# Patient Record
Sex: Female | Born: 1980 | Race: Black or African American | Hispanic: No | Marital: Single | Smoking: Former smoker
Health system: Southern US, Community
[De-identification: ages and names within clinical notes are randomized; demographics above are authoritative.]

---

## 2012-03-09 ENCOUNTER — Emergency Department (HOSPITAL_COMMUNITY)
Admission: EM | Admit: 2012-03-09 | Discharge: 2012-03-09 | Payer: Self-pay | Attending: Emergency Medicine | Admitting: Emergency Medicine

## 2012-03-09 ENCOUNTER — Encounter (HOSPITAL_COMMUNITY): Payer: Self-pay | Admitting: Emergency Medicine

## 2012-03-09 DIAGNOSIS — M791 Myalgia, unspecified site: Secondary | ICD-10-CM

## 2012-03-09 DIAGNOSIS — IMO0001 Reserved for inherently not codable concepts without codable children: Secondary | ICD-10-CM | POA: Insufficient documentation

## 2012-03-09 DIAGNOSIS — F17200 Nicotine dependence, unspecified, uncomplicated: Secondary | ICD-10-CM | POA: Insufficient documentation

## 2012-03-09 LAB — URINALYSIS, ROUTINE W REFLEX MICROSCOPIC
Glucose, UA: NEGATIVE mg/dL
Ketones, ur: NEGATIVE mg/dL
Leukocytes, UA: NEGATIVE
Protein, ur: NEGATIVE mg/dL
Urobilinogen, UA: 2 mg/dL — ABNORMAL HIGH (ref 0.0–1.0)

## 2012-03-09 MED ORDER — IBUPROFEN 800 MG PO TABS
800.0000 mg | ORAL_TABLET | Freq: Once | ORAL | Status: AC
  Administered 2012-03-09: 800 mg via ORAL
  Filled 2012-03-09: qty 1

## 2012-03-09 MED ORDER — IBUPROFEN 800 MG PO TABS: 800.0000 mg | ORAL_TABLET | Freq: Once | ORAL | Status: AC

## 2012-03-09 NOTE — ED Provider Notes (Signed)
Medical screening examination/treatment/procedure(s) were performed by non-physician practitioner and as supervising physician I was immediately available for consultation/collaboration.   Alecea Trego A Peniel Hass, MD 03/09/12 1523 

## 2012-03-09 NOTE — Discharge Instructions (Signed)
FOLLOW UP WITH HERE OR WITH YOUR DOCTOR IF SYMPTOMS WORSEN. PUSH FLUIDS. IBUPROFEN AS DIRECTED.

## 2012-03-09 NOTE — ED Provider Notes (Signed)
History     CSN: [id number]  Arrival date & time 03/09/12  1127   First MD Initiated Contact with Patient 03/09/12 1155      Chief Complaint  Patient presents with  . Generalized Body Aches    (Consider location/radiation/quality/duration/timing/severity/associated sxs/prior treatment) Patient is a 31 y.o. female presenting with musculoskeletal pain. The history is provided by the patient.  Muscle Pain This is a new problem. The current episode started yesterday. The problem occurs constantly. The problem has been unchanged. Associated symptoms include myalgias. Pertinent negatives include no abdominal pain, chest pain, fever, nausea, rash or vomiting. 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 She became concerned when she developed symptoms of generalized body aches that she was becoming ill. No fever, N, V, D, change in appetite, dysuria, cough, congestion. She reports that last week she had some mild diarrhea that resolved prior to onset of current aches..    No past medical history on file.  No past surgical history on file.  No family history on file.  History  Substance Use Topics  . Smoking status: Current Everyday Smoker -- 5 years    Types: Cigarettes  . Smokeless tobacco: Not on file  . Alcohol Use: Yes     occasionally    OB History    Grav Para Term Preterm Abortions TAB SAB Ect Mult Living                  Review of Systems  Constitutional: Negative.  Negative for fever.  Respiratory: Negative for shortness of breath.   Cardiovascular: Negative for chest pain.  Gastrointestinal: Negative for nausea, vomiting and abdominal pain.  Genitourinary: Negative for dysuria and vaginal discharge.  Musculoskeletal: Positive for myalgias.  Skin: Negative for rash.    Allergies  Review of patient's allergies indicates no known allergies.  Home Medications   Current Outpatient Rx  Name Route  Sig Dispense Refill  . PHENYLEPH-CPM-DM-ASPIRIN 7.06-11-09-325 MG PO TBEF Oral Take 2 tablets by mouth every 6 (six) hours as needed. cold      BP 106/68  Pulse 71  Temp(Src) 98.1 F (36.7 C) (Oral)  Resp 16  SpO2 100%  LMP 03/01/2012  Physical Exam  Constitutional: She is oriented to person, place, and time. She appears well-developed and well-nourished.  HENT:  Head: Normocephalic.  Mouth/Throat: Oropharynx is clear and moist.       Nasal mucosa mildly erythematous and swollen.  Neck: Normal range of motion. Neck supple.  Cardiovascular: Normal rate and regular rhythm.   Pulmonary/Chest: Effort normal and breath sounds normal.  Abdominal: Soft. Bowel sounds are normal. There is no tenderness. There is no rebound and no guarding.  Musculoskeletal: Normal range of motion. She exhibits no edema and no tenderness.       FROM all joint. No swelling over any area observed.  Neurological: She is alert and oriented to person, place, and time.  Skin: Skin is warm and dry. No rash noted.       No redness.  Psychiatric: She has a normal mood and affect.    ED Course  Procedures (including critical care time)   Labs Reviewed  URINALYSIS, ROUTINE W REFLEX MICROSCOPIC  PREGNANCY, URINE   Results for orders placed during the hospital encounter of 03/09/12  URINALYSIS, ROUTINE W REFLEX MICROSCOPIC      Component Value Range   Color, Urine YELLOW  YELLOW    APPearance CLOUDY (*)  CLEAR    Specific Gravity, Urine 1.020  1.005 - 1.030    pH 7.5  5.0 - 8.0    Glucose, UA NEGATIVE  NEGATIVE (mg/dL)   Hgb urine dipstick NEGATIVE  NEGATIVE    Bilirubin Urine NEGATIVE  NEGATIVE    Ketones, ur NEGATIVE  NEGATIVE (mg/dL)   Protein, ur NEGATIVE  NEGATIVE (mg/dL)   Urobilinogen, UA 2.0 (*) 0.0 - 1.0 (mg/dL)   Nitrite NEGATIVE  NEGATIVE    Leukocytes, UA NEGATIVE  NEGATIVE   PREGNANCY, URINE      Component Value Range   Preg Test, Ur NEGATIVE  NEGATIVE     No results found. No diagnosis  found.  1. Myalgias   MDM  Urine normal, no evidence dehydration or infection. VSS, normal. Discussed treatment with ibuprofen and follow up.        Rodena Medin, PA-C 03/09/12 1317

## 2012-03-09 NOTE — ED Notes (Signed)
Pt c/o generalized body aches and weakness since yesterday.

## 2012-12-26 ENCOUNTER — Emergency Department (HOSPITAL_COMMUNITY)
Admission: EM | Admit: 2012-12-26 | Discharge: 2012-12-26 | Disposition: A | Discharge status: Home / Self Care | Payer: BC Managed Care – PPO | Attending: Emergency Medicine | Admitting: Emergency Medicine

## 2012-12-26 ENCOUNTER — Encounter (HOSPITAL_COMMUNITY): Payer: Self-pay | Admitting: Emergency Medicine

## 2012-12-26 DIAGNOSIS — F17200 Nicotine dependence, unspecified, uncomplicated: Secondary | ICD-10-CM | POA: Insufficient documentation

## 2012-12-26 DIAGNOSIS — M940 Chondrocostal junction syndrome [Tietze]: Secondary | ICD-10-CM | POA: Insufficient documentation

## 2012-12-26 MED ORDER — HYDROCODONE-ACETAMINOPHEN 5-500 MG PO CAPS: 1.0000 | ORAL_CAPSULE | Freq: Four times a day (QID) | ORAL | Status: DC | PRN

## 2012-12-26 MED ORDER — IBUPROFEN 800 MG PO TABS
800.0000 mg | ORAL_TABLET | Freq: Once | ORAL | Status: AC
  Administered 2012-12-26: 800 mg via ORAL
  Filled 2012-12-26: qty 1

## 2012-12-26 MED ORDER — NAPROXEN 500 MG PO TABS: 500.0000 mg | ORAL_TABLET | Freq: Two times a day (BID) | ORAL | Status: DC

## 2012-12-26 NOTE — ED Notes (Signed)
Patient instructed on use of IS. Patient did return demo.

## 2012-12-26 NOTE — ED Provider Notes (Signed)
History     CSN: [id number]  Arrival date & time 12/26/12  4098   First MD Initiated Contact with Patient 12/26/12 [phone]      Chief Complaint  Patient presents with  . Chest Pain    (Consider location/radiation/quality/duration/timing/severity/associated sxs/prior treatment) HPI 32 year old female presents to emergency room complaining of anterior chest pain since last evening around 8:00. She denies any injury to the area, no new activities, no cough. Pain is worse with movement and with palpation. No treatment prior to arrival. Patient is a CNA. She does report that yesterday at work she was doing several patients move around. She denies any acute event. Pain came on gradually. She denies any fever or chills. No similar symptoms. No leg swelling, no birth control, no family history of PE or DVT. No pleuritic pain    History reviewed. No pertinent past medical history.  History reviewed. No pertinent past surgical history.  History reviewed. No pertinent family history.  History  Substance Use Topics  . Smoking status: Current Every Day Smoker -- 0.50 packs/day for 6 years    Types: Cigarettes  . Smokeless tobacco: Not on file  . Alcohol Use: Yes     Comment: occasionally    OB History   Grav Para Term Preterm Abortions TAB SAB Ect Mult Living                  Review of Systems  See History of Present Illness; otherwise all other systems are reviewed and negative Allergies  Review of patient's allergies indicates no known allergies.  Home Medications   Current Outpatient Rx  Name  Route  Sig  Dispense  Refill  . hydrocodone-acetaminophen (LORCET-HD) 5-500 MG per capsule   Oral   Take 1 capsule by mouth every 6 (six) hours as needed for pain.   30 capsule   0   . naproxen (NAPROSYN) 500 MG tablet   Oral   Take 1 tablet (500 mg total) by mouth 2 (two) times daily.   30 tablet   0     BP 116/69  Pulse 56  Temp(Src) 98.7 F (37.1 C) (Oral)  Ht 5\' 6"   (1.676 m)  Wt 185 lb (83.915 kg)  BMI 29.87 kg/m2  SpO2 97%  LMP 12/22/2012  Physical Exam  Nursing note and vitals reviewed. Constitutional: She is oriented to person, place, and time. She appears well-developed and well-nourished.  HENT:  Head: Normocephalic and atraumatic.  Nose: Nose normal.  Mouth/Throat: Oropharynx is clear and moist.  Eyes: Conjunctivae and EOM are normal. Pupils are equal, round, and reactive to light.  Neck: Normal range of motion. Neck supple. No JVD present. No tracheal deviation present. No thyromegaly present.  Cardiovascular: Normal rate, regular rhythm, normal heart sounds and intact distal pulses.  Exam reveals no gallop and no friction rub.   No murmur heard. Pulmonary/Chest: Effort normal and breath sounds normal. No stridor. No respiratory distress. She has no wheezes. She has no rales. She exhibits tenderness (patient with sternal pain with palpation. Patient of the sternum reproduces symptoms  completely).  Abdominal: Soft. Bowel sounds are normal. She exhibits no distension and no mass. There is no tenderness. There is no rebound and no guarding.  Musculoskeletal: Normal range of motion. She exhibits no edema and no tenderness.  Lymphadenopathy:    She has no cervical adenopathy.  Neurological: She is alert and oriented to person, place, and time. She has normal reflexes. No cranial nerve deficit. She exhibits  normal muscle tone. Coordination normal.  Skin: Skin is warm and dry. No rash noted. No erythema. No pallor.  Psychiatric: She has a normal mood and affect. Her behavior is normal. Judgment and thought content normal.    ED Course  Procedures (including critical care time)  Labs Reviewed - No data to display No results found.   Date: 12/26/2012  Rate: 64  Rhythm: normal sinus rhythm  QRS Axis: normal  Intervals: normal  ST/T Wave abnormalities: normal  Conduction Disutrbances:none  Narrative Interpretation:   Old EKG Reviewed: none  available    1. Acute costochondritis       MDM  32 year old female with anterior chest wall pain, consistent with costochondritis. EKG unremarkable. Will treat with anti-inflammatories and short course of opiates.        Olivia Mackie, MD 12/26/12 [phone]

## 2012-12-26 NOTE — ED Notes (Signed)
Patient c/o mid sternal chest pain which started at 2000 on 12-25-2012. Patient denies cough, n/v. States pain is increased with movement. Patient denies taking any medication for pain at home PTA.

## 2012-12-26 NOTE — ED Notes (Signed)
Patient tearful while walking to room, a wheelchair was offered but patient declined. Patient placed in gown, EKG completed. VSS. Patient denies any medical history or current medications, neg for family cardiac history. Patient states she had pizza for dinner last night, was laying on the couch watching television when pain started at approx 2000 on 12/25/12. Patient states this is the first occurrence of this pain.

## 2014-01-17 ENCOUNTER — Encounter (HOSPITAL_COMMUNITY): Payer: Self-pay | Admitting: Emergency Medicine

## 2014-01-17 ENCOUNTER — Emergency Department (HOSPITAL_COMMUNITY)
Admission: EM | Admit: 2014-01-17 | Discharge: 2014-01-17 | Disposition: A | Discharge status: Home / Self Care | Payer: Medicaid - Out of State | Attending: Emergency Medicine | Admitting: Emergency Medicine

## 2014-01-17 DIAGNOSIS — F17200 Nicotine dependence, unspecified, uncomplicated: Secondary | ICD-10-CM | POA: Insufficient documentation

## 2014-01-17 DIAGNOSIS — J029 Acute pharyngitis, unspecified: Secondary | ICD-10-CM | POA: Insufficient documentation

## 2014-01-17 LAB — RAPID STREP SCREEN (MED CTR MEBANE ONLY): STREPTOCOCCUS, GROUP A SCREEN (DIRECT): NEGATIVE

## 2014-01-17 MED ORDER — DEXAMETHASONE SODIUM PHOSPHATE 10 MG/ML IJ SOLN
10.0000 mg | Freq: Once | INTRAMUSCULAR | Status: AC
  Administered 2014-01-17: 10 mg via INTRAMUSCULAR
  Filled 2014-01-17: qty 1

## 2014-01-17 MED ORDER — TRAMADOL HCL 50 MG PO TABS: 50.0000 mg | ORAL_TABLET | Freq: Four times a day (QID) | ORAL | Status: DC | PRN

## 2014-01-17 MED ORDER — TRAMADOL HCL 50 MG PO TABS
50.0000 mg | ORAL_TABLET | Freq: Once | ORAL | Status: AC
  Administered 2014-01-17: 50 mg via ORAL
  Filled 2014-01-17: qty 1

## 2014-01-17 NOTE — ED Notes (Signed)
Pt reports right side neck pain that she describes as sore throat. NAD. Reports taking OTC with no relief.

## 2014-01-17 NOTE — ED Provider Notes (Signed)
CSN: [id number]     Arrival date & time 01/17/14  1711 History  This chart was scribed for Erin BleacherJosh Derrick Tiegs, PA, working with Nelia Shiobert L Beaton, MD, by Ardelia Memsylan Malpass ED Scribe. This patient was seen in room WTR6/WTR6 and the patient's care was started at 7:13 PM.   Chief Complaint  Patient presents with  . Sore Throat    The history is provided by the patient. No language interpreter was used.    HPI Comments: Thornton Dalesshley Warzecha is a 33 y.o. female who presents to the Emergency Department complaining of a sore throat over the past few days. She states that she has not been able to sleep at night due to this pain. She states that she has been taking OTC medications without relief.   History reviewed. No pertinent past medical history. History reviewed. No pertinent past surgical history. No family history on file. History  Substance Use Topics  . Smoking status: Current Every Day Smoker -- 0.50 packs/day for 6 years    Types: Cigarettes  . Smokeless tobacco: Not on file  . Alcohol Use: Yes     Comment: occasionally   OB History   Grav Para Term Preterm Abortions TAB SAB Ect Mult Living                 Review of Systems  Constitutional: Negative for fever, chills and fatigue.  HENT: Positive for sore throat. Negative for congestion, ear pain, rhinorrhea, sinus pressure and trouble swallowing.   Eyes: Negative for redness.  Respiratory: Negative for cough and wheezing.   Gastrointestinal: Negative for nausea, vomiting, abdominal pain and diarrhea.  Genitourinary: Negative for dysuria.  Musculoskeletal: Negative for myalgias and neck stiffness.  Skin: Negative for rash.  Neurological: Negative for headaches.  Hematological: Negative for adenopathy.    Allergies  Review of patient's allergies indicates no known allergies.  Home Medications   Current Outpatient Rx  Name  Route  Sig  Dispense  Refill  . traMADol (ULTRAM) 50 MG tablet   Oral   Take 1 tablet (50 mg total) by mouth  every 6 (six) hours as needed.   10 tablet   0    Triage Vitals: BP 111/87  Pulse 69  Temp(Src) 98.1 F (36.7 C) (Oral)  Resp 16  Ht 5\' 6"  (1.676 m)  Wt 195 lb (88.451 kg)  BMI 31.49 kg/m2  SpO2 100%  LMP 01/17/2014  Physical Exam  Nursing note and vitals reviewed. Constitutional: She appears well-developed and well-nourished. No distress.  HENT:  Head: Normocephalic and atraumatic.  Right Ear: Tympanic membrane, external ear and ear canal normal.  Left Ear: Tympanic membrane, external ear and ear canal normal.  Nose: Nose normal. No mucosal edema or rhinorrhea.  Mouth/Throat: Uvula is midline and mucous membranes are normal. Mucous membranes are not dry. No oral lesions. No trismus in the jaw. No uvula swelling. Posterior oropharyngeal erythema present. No oropharyngeal exudate, posterior oropharyngeal edema or tonsillar abscesses.  Eyes: Conjunctivae and EOM are normal. Right eye exhibits no discharge. Left eye exhibits no discharge.  Neck: Normal range of motion. Neck supple. No tracheal deviation present.  Cardiovascular: Normal rate, regular rhythm and normal heart sounds.   Pulmonary/Chest: Effort normal and breath sounds normal. No respiratory distress. She has no wheezes. She has no rales.  Abdominal: Soft. There is no tenderness.  Musculoskeletal: Normal range of motion.  Lymphadenopathy:    She has no cervical adenopathy.  Neurological: She is alert.  Skin: Skin is warm  and dry.  Psychiatric: She has a normal mood and affect. Her behavior is normal.    ED Course  Procedures (including critical care time)  DIAGNOSTIC STUDIES: Oxygen Saturation is 100% on RA, normal by my interpretation.    COORDINATION OF CARE: 7:17 PM- Discussed negative Strep test findings. Will discharge with Tramadol. Advised return precautions. Pt advised of plan for treatment and pt agrees.  Labs Review Labs Reviewed  RAPID STREP SCREEN  CULTURE, GROUP A STREP   Imaging Review No  results found.   EKG Interpretation None      Patient seen and examined.   Vital signs reviewed and are as follows: Filed Vitals:   01/17/14 1933  BP: 128/45  Pulse: 77  Temp:   Resp: 18   Patient counseled on supportive care for viral URI and s/s to return including worsening symptoms, persistent fever, persistent vomiting, or if they have any other concerns. Urged to see PCP if symptoms persist for more than 3 days. Patient verbalizes understanding and agrees with plan.    MDM   Final diagnoses:  Sore throat   Patient with sore throat, no fever, negative strep. No peritonsillar abscess. No deep space infection suspected.  I personally performed the services described in this documentation, which was scribed in my presence. The recorded information has been reviewed and is accurate.   Renne Crigler, PA-C 01/17/14 2022

## 2014-01-17 NOTE — Discharge Instructions (Signed)
Please read and follow all provided instructions.  Your diagnoses today include:  1. Sore throat     Tests performed today include:  Strep test: was negative for strep throat  Vital signs. See below for your results today.   Medications prescribed:   Tramadol - narcotic-like pain medication  DO NOT drive or perform any activities that require you to be awake and alert because this medicine can make you drowsy.   Home care instructions:  Please read the educational materials provided and follow any instructions contained in this packet.  Follow-up instructions: Please follow-up with your primary care provider as needed for further evaluation of your symptoms.  If you do not have a primary care doctor -- see below for referral information.   Return instructions:   Please return to the Emergency Department if you experience worsening symptoms.   Return if you have worsening problems swallowing, your neck becomes swollen, you cannot swallow your saliva or your voice becomes muffled.   Return with high persistent fever, persistent vomiting, or if you have trouble breathing.   Please return if you have any other emergent concerns.  Additional Information:  Your vital signs today were: BP 111/87   Pulse 69   Temp(Src) 98.1 F (36.7 C) (Oral)   Resp 16   Ht 5\' 6"  (1.676 m)   Wt 195 lb (88.451 kg)   BMI 31.49 kg/m2   SpO2 100%   LMP 01/17/2014 If your blood pressure (BP) was elevated above 135/85 this visit, please have this repeated by your doctor within one month. --------------

## 2014-01-19 LAB — CULTURE, GROUP A STREP

## 2014-01-30 NOTE — ED Provider Notes (Signed)
Medical screening examination/treatment/procedure(s) were performed by non-physician practitioner and as supervising physician I was immediately available for consultation/collaboration.   Nelia Shiobert L Daizha Anand, MD 01/30/14 1113

## 2015-06-05 ENCOUNTER — Emergency Department (HOSPITAL_COMMUNITY)
Admission: EM | Admit: 2015-06-05 | Discharge: 2015-06-05 | Disposition: A | Discharge status: Home / Self Care | Payer: Medicaid - Out of State | Attending: Emergency Medicine | Admitting: Emergency Medicine

## 2015-06-05 ENCOUNTER — Encounter (HOSPITAL_COMMUNITY): Payer: Self-pay | Admitting: Emergency Medicine

## 2015-06-05 ENCOUNTER — Emergency Department (HOSPITAL_COMMUNITY): Payer: Medicaid - Out of State

## 2015-06-05 DIAGNOSIS — R0789 Other chest pain: Secondary | ICD-10-CM | POA: Diagnosis not present

## 2015-06-05 DIAGNOSIS — Z720 Tobacco use: Secondary | ICD-10-CM | POA: Insufficient documentation

## 2015-06-05 DIAGNOSIS — R079 Chest pain, unspecified: Secondary | ICD-10-CM | POA: Diagnosis present

## 2015-06-05 LAB — CBC WITH DIFFERENTIAL/PLATELET
BASOS PCT: 0 % (ref 0–1)
Basophils Absolute: 0 10*3/uL (ref 0.0–0.1)
EOS ABS: 0.2 10*3/uL (ref 0.0–0.7)
EOS PCT: 2 % (ref 0–5)
HEMATOCRIT: 42.9 % (ref 36.0–46.0)
HEMOGLOBIN: 14.3 g/dL (ref 12.0–15.0)
Lymphocytes Relative: 23 % (ref 12–46)
Lymphs Abs: 2 10*3/uL (ref 0.7–4.0)
MCH: 30.6 pg (ref 26.0–34.0)
MCHC: 33.3 g/dL (ref 30.0–36.0)
MCV: 91.9 fL (ref 78.0–100.0)
Monocytes Absolute: 0.6 10*3/uL (ref 0.1–1.0)
Monocytes Relative: 7 % (ref 3–12)
NEUTROS PCT: 68 % (ref 43–77)
Neutro Abs: 5.9 10*3/uL (ref 1.7–7.7)
Platelets: 311 10*3/uL (ref 150–400)
RBC: 4.67 MIL/uL (ref 3.87–5.11)
RDW: 11.9 % (ref 11.5–15.5)
WBC: 8.7 10*3/uL (ref 4.0–10.5)

## 2015-06-05 LAB — I-STAT TROPONIN, ED: TROPONIN I, POC: 0 ng/mL (ref 0.00–0.08)

## 2015-06-05 LAB — BASIC METABOLIC PANEL
ANION GAP: 6 (ref 5–15)
BUN: 10 mg/dL (ref 6–20)
CALCIUM: 9.5 mg/dL (ref 8.9–10.3)
CHLORIDE: 107 mmol/L (ref 101–111)
CO2: 26 mmol/L (ref 22–32)
Creatinine, Ser: 0.86 mg/dL (ref 0.44–1.00)
GFR calc non Af Amer: >60 mL/min (ref >60)
GLUCOSE: 98 mg/dL (ref 65–99)
POTASSIUM: 3.8 mmol/L (ref 3.5–5.1)
SODIUM: 139 mmol/L (ref 135–145)

## 2015-06-05 MED ORDER — MORPHINE SULFATE 4 MG/ML IJ SOLN
4.0000 mg | Freq: Once | INTRAMUSCULAR | Status: AC
  Administered 2015-06-05: 4 mg via INTRAVENOUS
  Filled 2015-06-05: qty 1

## 2015-06-05 MED ORDER — NAPROXEN 375 MG PO TABS
375.0000 mg | ORAL_TABLET | Freq: Two times a day (BID) | ORAL | Status: DC
Start: 2015-06-05 — End: 2016-06-09

## 2015-06-05 NOTE — ED Provider Notes (Signed)
CSN: [id number]     Arrival date & time 06/05/15  1120 History   First MD Initiated Contact with Patient 06/05/15 1136     Chief Complaint  Patient presents with  . Chest Pain     (Consider location/radiation/quality/duration/timing/severity/associated sxs/prior Treatment) HPI Comments: Patient presents to the emergency department with chief complaint of central chest pain started yesterday. Patient states that it is worsened with movement and deep breathing. She reports that it is also worsened with a cough. She denies any associated fevers, chills, shortness of breath, nausea, vomiting, abdominal pain, or other symptoms. She states that she has felt pain similar to this in the past, but didn't know what it was. She has not tried taking anything to alleviate her symptoms. She states that she is in everyday smoker. She denies any history of PE, or DVT. Denies any cardiac history. Denies any other medical problems.  The history is provided by the patient. No language interpreter was used.    History reviewed. No pertinent past medical history. History reviewed. No pertinent past surgical history. History reviewed. No pertinent family history. History  Substance Use Topics  . Smoking status: Current Every Day Smoker -- 0.50 packs/day for 6 years    Types: Cigarettes  . Smokeless tobacco: Not on file  . Alcohol Use: Yes     Comment: occasionally   OB History    No data available     Review of Systems  Constitutional: Negative for fever and chills.  Respiratory: Negative for shortness of breath.   Cardiovascular: Positive for chest pain.  Gastrointestinal: Negative for nausea, vomiting, diarrhea and constipation.  Genitourinary: Negative for dysuria.  All other systems reviewed and are negative.     Allergies  Review of patient's allergies indicates no known allergies.  Home Medications   Prior to Admission medications   Not on File   BP 118/68 mmHg  Pulse 68  Temp(Src)  98.2 F (36.8 C) (Oral)  Resp 18  SpO2 97% Physical Exam  Constitutional: She is oriented to person, place, and time. She appears well-developed and well-nourished.  HENT:  Head: Normocephalic and atraumatic.  Eyes: Conjunctivae and EOM are normal. Pupils are equal, round, and reactive to light.  Neck: Normal range of motion. Neck supple.  Cardiovascular: Normal rate and regular rhythm.  Exam reveals no gallop and no friction rub.   No murmur heard. Pulmonary/Chest: Effort normal and breath sounds normal. No respiratory distress. She has no wheezes. She has no rales. She exhibits no tenderness.  Anterior chest wall tender to palpation Clear to auscultation bilaterally  Abdominal: Soft. Bowel sounds are normal. She exhibits no distension and no mass. There is no tenderness. There is no rebound and no guarding.  Musculoskeletal: Normal range of motion. She exhibits no edema or tenderness.  Neurological: She is alert and oriented to person, place, and time.  Skin: Skin is warm and dry.  Psychiatric: She has a normal mood and affect. Her behavior is normal. Judgment and thought content normal.  Nursing note and vitals reviewed.   ED Course  Procedures (including critical care time) Results for orders placed or performed during the hospital encounter of 06/05/15  CBC with Differential/Platelet  Result Value Ref Range   WBC 8.7 4.0 - 10.5 K/uL   RBC 4.67 3.87 - 5.11 MIL/uL   Hemoglobin 14.3 12.0 - 15.0 g/dL   HCT 40.9 81.1 - 91.4 %   MCV 91.9 78.0 - 100.0 fL   MCH 30.6 26.0 -  34.0 pg   MCHC 33.3 30.0 - 36.0 g/dL   RDW 86.5 78.4 - 69.6 %   Platelets 311 150 - 400 K/uL   Neutrophils Relative % 68 43 - 77 %   Neutro Abs 5.9 1.7 - 7.7 K/uL   Lymphocytes Relative 23 12 - 46 %   Lymphs Abs 2.0 0.7 - 4.0 K/uL   Monocytes Relative 7 3 - 12 %   Monocytes Absolute 0.6 0.1 - 1.0 K/uL   Eosinophils Relative 2 0 - 5 %   Eosinophils Absolute 0.2 0.0 - 0.7 K/uL   Basophils Relative 0 0 - 1 %    Basophils Absolute 0.0 0.0 - 0.1 K/uL  Basic metabolic panel  Result Value Ref Range   Sodium 139 135 - 145 mmol/L   Potassium 3.8 3.5 - 5.1 mmol/L   Chloride 107 101 - 111 mmol/L   CO2 26 22 - 32 mmol/L   Glucose, Bld 98 65 - 99 mg/dL   BUN 10 6 - 20 mg/dL   Creatinine, Ser 2.95 0.44 - 1.00 mg/dL   Calcium 9.5 8.9 - 28.4 mg/dL   GFR calc non Af Amer >60 >60 mL/min   GFR calc Af Amer >60 >60 mL/min   Anion gap 6 5 - 15  I-Stat Troponin, ED (not at [facility])  Result Value Ref Range   Troponin i, poc 0.00 0.00 - 0.08 ng/mL   Comment 3           Dg Chest 2 View  06/05/2015   CLINICAL DATA:  Chest pain since yesterday  EXAM: CHEST  2 VIEW  COMPARISON:  None.  FINDINGS: The heart size and mediastinal contours are within normal limits. Both lungs are clear. The visualized skeletal structures are unremarkable.  IMPRESSION: No active cardiopulmonary disease.   Electronically Signed   By: Sherian Rein M.D.   On: 06/05/2015 12:19     Imaging Review No results found.   EKG Interpretation   Date/Time:  Tuesday June 05 2015 11:30:41 EDT Ventricular Rate:  70 PR Interval:  168 QRS Duration: 79 QT Interval:  386 QTC Calculation: 416 R Axis:   79 Text Interpretation:  Sinus rhythm No significant change since last  tracing Confirmed by JACUBOWITZ  MD, SAM [phone]) on 06/05/2015 11:34:44 AM      MDM   Final diagnoses:  Chest pain  Chest wall pain    Patient with anterior chest pain 1 day. Pain has been constant. It is worsened with movement and palpation. She has not taken anything for the pain.  Heart score is 1, troponin is negative, EKG reassuring, CXR negative, PERC negative. Suspect that this is chest wall pain, as the pain is reproducible with palpation.    Roxy Horseman, PA-C 06/05/15 1340  Doug Sou, MD 06/05/15 1640

## 2015-06-05 NOTE — ED Notes (Signed)
Pt reports central chest pain that started yesterday. Pain worse with movement and deep breath. Pt denies any other associated symptoms.

## 2015-06-05 NOTE — Discharge Instructions (Signed)

## 2016-06-09 ENCOUNTER — Emergency Department (HOSPITAL_COMMUNITY): Payer: No Typology Code available for payment source

## 2016-06-09 ENCOUNTER — Encounter (HOSPITAL_COMMUNITY): Payer: Self-pay | Admitting: Emergency Medicine

## 2016-06-09 ENCOUNTER — Emergency Department (HOSPITAL_COMMUNITY)
Admission: EM | Admit: 2016-06-09 | Discharge: 2016-06-09 | Disposition: A | Discharge status: Home / Self Care | Payer: No Typology Code available for payment source | Attending: Emergency Medicine | Admitting: Emergency Medicine

## 2016-06-09 DIAGNOSIS — Z23 Encounter for immunization: Secondary | ICD-10-CM | POA: Diagnosis not present

## 2016-06-09 DIAGNOSIS — Y939 Activity, unspecified: Secondary | ICD-10-CM | POA: Insufficient documentation

## 2016-06-09 DIAGNOSIS — M62838 Other muscle spasm: Secondary | ICD-10-CM | POA: Insufficient documentation

## 2016-06-09 DIAGNOSIS — Y999 Unspecified external cause status: Secondary | ICD-10-CM | POA: Insufficient documentation

## 2016-06-09 DIAGNOSIS — Y92410 Unspecified street and highway as the place of occurrence of the external cause: Secondary | ICD-10-CM | POA: Insufficient documentation

## 2016-06-09 DIAGNOSIS — S4991XA Unspecified injury of right shoulder and upper arm, initial encounter: Secondary | ICD-10-CM | POA: Diagnosis present

## 2016-06-09 DIAGNOSIS — F17210 Nicotine dependence, cigarettes, uncomplicated: Secondary | ICD-10-CM | POA: Insufficient documentation

## 2016-06-09 DIAGNOSIS — T148XXA Other injury of unspecified body region, initial encounter: Secondary | ICD-10-CM

## 2016-06-09 DIAGNOSIS — S46911A Strain of unspecified muscle, fascia and tendon at shoulder and upper arm level, right arm, initial encounter: Secondary | ICD-10-CM | POA: Insufficient documentation

## 2016-06-09 MED ORDER — NAPROXEN 500 MG PO TABS: 500.0000 mg | ORAL_TABLET | Freq: Two times a day (BID) | ORAL | 0 refills | Status: DC | PRN

## 2016-06-09 MED ORDER — CYCLOBENZAPRINE HCL 10 MG PO TABS: 10.0000 mg | ORAL_TABLET | Freq: Three times a day (TID) | ORAL | 0 refills | Status: DC | PRN

## 2016-06-09 MED ORDER — TETANUS-DIPHTH-ACELL PERTUSSIS 5-2.5-18.5 LF-MCG/0.5 IM SUSP
0.5000 mL | Freq: Once | INTRAMUSCULAR | Status: AC
  Administered 2016-06-09: 0.5 mL via INTRAMUSCULAR
  Filled 2016-06-09: qty 0.5

## 2016-06-09 MED ORDER — NAPROXEN 500 MG PO TABS
500.0000 mg | ORAL_TABLET | Freq: Once | ORAL | Status: AC
  Administered 2016-06-09: 500 mg via ORAL
  Filled 2016-06-09: qty 1
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
## 2016-06-09 NOTE — Discharge Instructions (Signed)
Take naprosyn as directed for inflammation and pain with tylenol for breakthrough pain and flexeril for muscle relaxation. Do not drive or operate machinery with muscle relaxant use. Ice to areas of soreness for the next 24 hours and then may move to heat, no more than 20 minutes at a time every hour for each. Expect to be sore for the next few days and follow up with [facility] in 1-2 weeks to establish care and for recheck of ongoing symptoms. Return to ER for emergent changing or worsening of symptoms.

## 2017-09-15 ENCOUNTER — Emergency Department (HOSPITAL_BASED_OUTPATIENT_CLINIC_OR_DEPARTMENT_OTHER)
Admission: EM | Admit: 2017-09-15 | Discharge: 2017-09-15 | Discharge status: Against Medical Advice | Payer: Medicaid Other | Source: Home / Self Care

## 2017-09-15 ENCOUNTER — Other Ambulatory Visit: Payer: Self-pay

## 2017-09-15 ENCOUNTER — Emergency Department (HOSPITAL_BASED_OUTPATIENT_CLINIC_OR_DEPARTMENT_OTHER)
Admission: EM | Admit: 2017-09-15 | Discharge: 2017-09-15 | Disposition: A | Discharge status: Home / Self Care | Payer: Medicaid Other | Attending: Emergency Medicine | Admitting: Emergency Medicine

## 2017-09-15 ENCOUNTER — Encounter (HOSPITAL_BASED_OUTPATIENT_CLINIC_OR_DEPARTMENT_OTHER): Payer: Self-pay | Admitting: *Deleted

## 2017-09-15 ENCOUNTER — Encounter (HOSPITAL_BASED_OUTPATIENT_CLINIC_OR_DEPARTMENT_OTHER): Payer: Self-pay | Admitting: Emergency Medicine

## 2017-09-15 DIAGNOSIS — N898 Other specified noninflammatory disorders of vagina: Secondary | ICD-10-CM | POA: Diagnosis present

## 2017-09-15 DIAGNOSIS — Z5321 Procedure and treatment not carried out due to patient leaving prior to being seen by health care provider: Secondary | ICD-10-CM | POA: Diagnosis not present

## 2017-09-15 LAB — URINALYSIS, ROUTINE W REFLEX MICROSCOPIC
Bilirubin Urine: NEGATIVE
GLUCOSE, UA: NEGATIVE mg/dL
Hgb urine dipstick: NEGATIVE
Ketones, ur: NEGATIVE mg/dL
Leukocytes, UA: NEGATIVE
Nitrite: NEGATIVE
PROTEIN: NEGATIVE mg/dL
Specific Gravity, Urine: 1.025 (ref 1.005–1.030)
pH: 6.5 (ref 5.0–8.0)

## 2017-09-15 LAB — PREGNANCY, URINE: PREG TEST UR: NEGATIVE

## 2017-09-15 NOTE — ED Triage Notes (Signed)
Patient to the nurse first desk asking about how much longer the wait was. Wait times and order of patients taken to a room explained. Patient upset. Asked patient for a repeat urine sample  - denies wanting to provide one.

## 2017-09-15 NOTE — ED Triage Notes (Signed)
Patient walked out to parking lot. Patient did not talk to this RN prior to leaving

## 2017-09-15 NOTE — ED Triage Notes (Signed)
Vaginal discharge and abd pain x 5 days. States she thinks has BV. Was here earlier but left before being seen.

## 2017-09-15 NOTE — ED Triage Notes (Signed)
Patient called for room. Patient did not answer. Looked for patient in parking lot, no patient noted.

## 2017-09-15 NOTE — ED Triage Notes (Signed)
Abdominal pain x 5 days. Vaginal discharge. She thinks she has BV.

## 2017-09-15 NOTE — ED Notes (Signed)
Called to room No answer

## 2017-09-15 NOTE — ED Notes (Signed)
The patient reports she had to leave and she is now back to be seen. Urine has already been collected.

## 2017-10-27 ENCOUNTER — Encounter (HOSPITAL_BASED_OUTPATIENT_CLINIC_OR_DEPARTMENT_OTHER): Payer: Self-pay | Admitting: *Deleted

## 2017-10-27 ENCOUNTER — Emergency Department (HOSPITAL_BASED_OUTPATIENT_CLINIC_OR_DEPARTMENT_OTHER)
Admission: EM | Admit: 2017-10-27 | Discharge: 2017-10-27 | Disposition: A | Discharge status: Home / Self Care | Payer: Medicaid Other | Attending: Emergency Medicine | Admitting: Emergency Medicine

## 2017-10-27 ENCOUNTER — Other Ambulatory Visit: Payer: Self-pay

## 2017-10-27 DIAGNOSIS — M545 Low back pain: Secondary | ICD-10-CM | POA: Diagnosis present

## 2017-10-27 DIAGNOSIS — Z5321 Procedure and treatment not carried out due to patient leaving prior to being seen by health care provider: Secondary | ICD-10-CM | POA: Diagnosis not present

## 2017-10-27 NOTE — ED Notes (Signed)
Pt informed registration that she had gotten a doctors appointment today and is leaving the department

## 2017-10-27 NOTE — ED Triage Notes (Signed)
Lower back pain for a week. She feels she hurt it while lifting.

## 2019-05-21 ENCOUNTER — Emergency Department (HOSPITAL_BASED_OUTPATIENT_CLINIC_OR_DEPARTMENT_OTHER): Payer: Self-pay

## 2019-05-21 ENCOUNTER — Encounter (HOSPITAL_BASED_OUTPATIENT_CLINIC_OR_DEPARTMENT_OTHER): Payer: Self-pay | Admitting: *Deleted

## 2019-05-21 ENCOUNTER — Other Ambulatory Visit: Payer: Self-pay

## 2019-05-21 ENCOUNTER — Emergency Department (HOSPITAL_BASED_OUTPATIENT_CLINIC_OR_DEPARTMENT_OTHER)
Admission: EM | Admit: 2019-05-21 | Discharge: 2019-05-21 | Disposition: A | Discharge status: Home / Self Care | Payer: Self-pay | Attending: Emergency Medicine | Admitting: Emergency Medicine

## 2019-05-21 DIAGNOSIS — Y998 Other external cause status: Secondary | ICD-10-CM | POA: Insufficient documentation

## 2019-05-21 DIAGNOSIS — Y929 Unspecified place or not applicable: Secondary | ICD-10-CM | POA: Insufficient documentation

## 2019-05-21 DIAGNOSIS — S8002XA Contusion of left knee, initial encounter: Secondary | ICD-10-CM | POA: Insufficient documentation

## 2019-05-21 DIAGNOSIS — W010XXA Fall on same level from slipping, tripping and stumbling without subsequent striking against object, initial encounter: Secondary | ICD-10-CM | POA: Insufficient documentation

## 2019-05-21 DIAGNOSIS — Y9302 Activity, running: Secondary | ICD-10-CM | POA: Insufficient documentation

## 2019-05-21 DIAGNOSIS — F17210 Nicotine dependence, cigarettes, uncomplicated: Secondary | ICD-10-CM | POA: Insufficient documentation

## 2019-05-21 NOTE — Discharge Instructions (Signed)
Please read and follow all provided instructions.  Your diagnoses today include:  1. Contusion of left knee, initial encounter     Tests performed today include:  An x-ray of the affected area - does NOT show any broken bones  Vital signs. See below for your results today.   Medications prescribed:  Please use over-the-counter NSAID medications (ibuprofen, naproxen) as directed on the packaging for pain.   Take any prescribed medications only as directed.  Home care instructions:   Follow any educational materials contained in this packet  Follow R.I.C.E. Protocol:  R - rest your injury   I  - use ice on injury without applying directly to skin  C - compress injury with bandage or splint  E - elevate the injury as much as possible  Follow-up instructions: Please follow-up with your primary care provider or the provided orthopedic physician (bone specialist) if you continue to have significant pain in 1 week. In this case you may have a more severe injury that requires further care.   Return instructions:   Please return if your toes or feet are numb or tingling, appear gray or blue, or you have severe pain (also elevate the leg and loosen splint or wrap if you were given one)  Please return to the Emergency Department if you experience worsening symptoms.   Please return if you have any other emergent concerns.  Additional Information:  Your vital signs today were: BP 127/88 (BP Location: Right Arm)    Pulse 86    Temp 98.5 F (36.9 C) (Oral)    Resp 18    Ht 5\' 5"  (1.651 m)    Wt 90.7 kg    LMP 05/16/2019    SpO2 100%    BMI 33.28 kg/m  If your blood pressure (BP) was elevated above 135/85 this visit, please have this repeated by your doctor within one month.
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
## 2019-05-21 NOTE — ED Notes (Signed)
ED Provider at bedside. 
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
## 2019-12-31 ENCOUNTER — Other Ambulatory Visit: Payer: Self-pay

## 2019-12-31 ENCOUNTER — Emergency Department (HOSPITAL_BASED_OUTPATIENT_CLINIC_OR_DEPARTMENT_OTHER)
Admission: EM | Admit: 2019-12-31 | Discharge: 2019-12-31 | Disposition: A | Discharge status: Home / Self Care | Payer: Self-pay | Attending: Emergency Medicine | Admitting: Emergency Medicine

## 2019-12-31 ENCOUNTER — Encounter (HOSPITAL_BASED_OUTPATIENT_CLINIC_OR_DEPARTMENT_OTHER): Payer: Self-pay | Admitting: *Deleted

## 2019-12-31 DIAGNOSIS — N760 Acute vaginitis: Secondary | ICD-10-CM | POA: Insufficient documentation

## 2019-12-31 DIAGNOSIS — N3000 Acute cystitis without hematuria: Secondary | ICD-10-CM | POA: Insufficient documentation

## 2019-12-31 DIAGNOSIS — B9689 Other specified bacterial agents as the cause of diseases classified elsewhere: Secondary | ICD-10-CM

## 2019-12-31 DIAGNOSIS — F17210 Nicotine dependence, cigarettes, uncomplicated: Secondary | ICD-10-CM | POA: Insufficient documentation

## 2019-12-31 LAB — URINALYSIS, ROUTINE W REFLEX MICROSCOPIC
Bilirubin Urine: NEGATIVE
Glucose, UA: NEGATIVE mg/dL
Hgb urine dipstick: NEGATIVE
Ketones, ur: NEGATIVE mg/dL
Nitrite: NEGATIVE
Protein, ur: NEGATIVE mg/dL
Specific Gravity, Urine: 1.025 (ref 1.005–1.030)
pH: 6 (ref 5.0–8.0)

## 2019-12-31 LAB — URINALYSIS, MICROSCOPIC (REFLEX)

## 2019-12-31 LAB — WET PREP, GENITAL
Sperm: NONE SEEN
Trich, Wet Prep: NONE SEEN
Yeast Wet Prep HPF POC: NONE SEEN

## 2019-12-31 LAB — PREGNANCY, URINE: Preg Test, Ur: NEGATIVE

## 2019-12-31 MED ORDER — LIDOCAINE HCL (PF) 1 % IJ SOLN
INTRAMUSCULAR | Status: AC
  Administered 2019-12-31: 19:00:00 1 mL
  Filled 2019-12-31: qty 5

## 2019-12-31 MED ORDER — DOXYCYCLINE HYCLATE 100 MG PO CAPS: 100.0000 mg | ORAL_CAPSULE | Freq: Two times a day (BID) | ORAL | 0 refills | Status: AC

## 2019-12-31 MED ORDER — METRONIDAZOLE 500 MG PO TABS: 500.0000 mg | ORAL_TABLET | Freq: Two times a day (BID) | ORAL | 0 refills | Status: AC

## 2019-12-31 MED ORDER — CEFTRIAXONE SODIUM 500 MG IJ SOLR
500.0000 mg | Freq: Once | INTRAMUSCULAR | Status: AC
  Administered 2019-12-31: 500 mg via INTRAMUSCULAR
  Filled 2019-12-31: qty 500

## 2019-12-31 NOTE — ED Provider Notes (Addendum)
[facility] EMERGENCY DEPARTMENT Provider Note   CSN: [id number] Arrival date & time: 12/31/19  1655     History Chief Complaint  Patient presents with  . SEXUALLY TRANSMITTED DISEASE    Erin Savage is a 39 y.o. female.  Patient here with concern for possible UTI versus STD.  Has had some vaginal discomfort for the last several weeks.  Has tried some over-the-counter medications for yeast infections with minimal improvement.  She is concerned about possible STD but denies any current abdominal pain or vaginal pain.  No discharge.  The history is provided by the patient.  Female GU Problem This is a new problem. The current episode started more than 1 week ago. The problem occurs daily. The problem has not changed since onset.Pertinent negatives include no chest pain, no abdominal pain, no headaches and no shortness of breath. Nothing aggravates the symptoms. Nothing relieves the symptoms. She has tried nothing for the symptoms. The treatment provided no relief.       History reviewed. No pertinent past medical history.  There are no problems to display for this patient.   History reviewed. No pertinent surgical history.   OB History   No obstetric history on file.     History reviewed. No pertinent family history.  Social History   Tobacco Use  . Smoking status: Current Every Day Smoker    Packs/day: 0.50    Years: 6.00    Pack years: 3.00    Types: Cigarettes  . Smokeless tobacco: Never Used  Substance Use Topics  . Alcohol use: Yes    Comment:  wine occasionally  . Drug use: No    Home Medications Prior to Admission medications   Medication Sig Start Date End Date Taking? Authorizing Provider  cyclobenzaprine (FLEXERIL) 10 MG tablet Take 1 tablet (10 mg total) by mouth 3 (three) times daily as needed for muscle spasms. 06/09/16   Street, [place], PA-C  doxycycline (VIBRAMYCIN) 100 MG capsule Take 1 capsule (100 mg total) by mouth 2 (two) times  daily for 7 days. 12/31/19 01/07/20  Maxey Ransom, DO  metroNIDAZOLE (FLAGYL) 500 MG tablet Take 1 tablet (500 mg total) by mouth 2 (two) times daily for 7 days. 12/31/19 01/07/20  Shad Ledvina, DO  naproxen (NAPROSYN) 500 MG tablet Take 1 tablet (500 mg total) by mouth 2 (two) times daily as needed for mild pain, moderate pain or headache (TAKE WITH MEALS.). 06/09/16   Street, [place], PA-C    Allergies    Patient has no known allergies.  Review of Systems   Review of Systems  Constitutional: Negative for chills and fever.  HENT: Negative for ear pain and sore throat.   Eyes: Negative for pain and visual disturbance.  Respiratory: Negative for cough and shortness of breath.   Cardiovascular: Negative for chest pain and palpitations.  Gastrointestinal: Negative for abdominal pain and vomiting.  Genitourinary: Positive for frequency. Negative for decreased urine volume, difficulty urinating, dyspareunia, dysuria, hematuria, pelvic pain, urgency, vaginal bleeding, vaginal discharge and vaginal pain.  Musculoskeletal: Negative for arthralgias and back pain.  Skin: Negative for color change and rash.  Neurological: Negative for seizures, syncope and headaches.  All other systems reviewed and are negative.   Physical Exam Updated Vital Signs  ED Triage Vitals  Enc Vitals Group     BP 12/31/19 1705 121/88     Pulse Rate 12/31/19 1705 89     Resp 12/31/19 1705 18     Temp 12/31/19 1705 98.1  F (36.7 C)     Temp Source 12/31/19 1705 Oral     SpO2 12/31/19 1705 100 %     Weight 12/31/19 1706 190 lb (86.2 kg)     Height 12/31/19 1706 5\' 5"  (1.651 m)     Head Circumference --      Peak Flow --      Pain Score 12/31/19 1706 7     Pain Loc --      Pain Edu? --      Excl. in [place]? --     Physical Exam Vitals and nursing note reviewed.  Constitutional:      General: She is not in acute distress.    Appearance: She is well-developed.  HENT:     Head: Normocephalic and atraumatic.    Eyes:     Conjunctiva/sclera: Conjunctivae normal.  Cardiovascular:     Rate and Rhythm: Normal rate and regular rhythm.     Heart sounds: No murmur.  Pulmonary:     Effort: Pulmonary effort is normal. No respiratory distress.     Breath sounds: Normal breath sounds.  Abdominal:     General: There is no distension.     Palpations: Abdomen is soft. There is no mass.     Tenderness: There is no abdominal tenderness. There is no guarding.     Hernia: No hernia is present.  Musculoskeletal:     Cervical back: Neck supple.  Skin:    General: Skin is warm and dry.  Neurological:     Mental Status: She is alert.  Psychiatric:        Mood and Affect: Mood normal.     ED Results / Procedures / Treatments   Labs (all labs ordered are listed, but only abnormal results are displayed) Labs Reviewed  WET PREP, GENITAL - Abnormal; Notable for the following components:      Result Value   Clue Cells Wet Prep HPF POC PRESENT (*)    WBC, Wet Prep HPF POC FEW (*)    All other components within normal limits  URINALYSIS, ROUTINE W REFLEX MICROSCOPIC - Abnormal; Notable for the following components:   APPearance HAZY (*)    Leukocytes,Ua TRACE (*)    All other components within normal limits  URINALYSIS, MICROSCOPIC (REFLEX) - Abnormal; Notable for the following components:   Bacteria, UA MANY (*)    All other components within normal limits  URINE CULTURE  PREGNANCY, URINE  GC/CHLAMYDIA PROBE AMP ([place]) NOT AT [facility]    EKG None  Radiology No results found.  Procedures Procedures (including critical care time)  Medications Ordered in ED Medications  cefTRIAXone (ROCEPHIN) injection 500 mg (has no administration in time range)    ED Course  I have reviewed the triage vital signs and the nursing notes.  Pertinent labs & imaging results that were available during my care of the patient were reviewed by me and considered in my medical decision making (see chart for  details).    MDM Rules/Calculators/A&P                      Erin [facility] is a 39 year old female with no significant medical history who presents to the ED with concern for STD or UTI.  Patient normal vitals.  No fever.  She has had lower abdominal discomfort and urinary frequency for the last several weeks.  Has tried some over-the-counter medications with no relief.  Concern for STD.  Denies any vaginal discharge  or bleeding.  No abdominal tenderness on exam.  GU exam deferred due to minimal symptoms and no fever.  Urinalysis possibly concerning for UTI and will treat.  Empirically treated for gonorrhea and chlamydia as well.  Positive for bacterial vaginosis.  Will treat with Flagyl.  Suspect symptoms are from bacterial vaginosis and possible UTI.  Gonorrhea and Chlamydia testing has been sent for.  She does not have any fevers or severe pain and no concern for pelvic inflammatory disease.  Pregnancy test is negative.  Discharged from ED in good condition.  Given return precautions.  This chart was dictated using voice recognition software.  Despite best efforts to proofread,  errors can occur which can change the documentation meaning.    Final Clinical Impression(s) / ED Diagnoses Final diagnoses:  Bacterial vaginosis  Acute cystitis without hematuria    Rx / DC Orders ED Discharge Orders         Ordered    doxycycline (VIBRAMYCIN) 100 MG capsule  2 times daily     12/31/19 1828    metroNIDAZOLE (FLAGYL) 500 MG tablet  2 times daily     12/31/19 1828           Virgina Norfolk, DO 12/31/19 1830    Virgina Norfolk, DO 12/31/19 [phone]

## 2019-12-31 NOTE — ED Triage Notes (Signed)
Pt reports that she would like to be tested for STDs. Unsure of vaginal discharge, reports dysuria and abnormal bleeding.

## 2020-01-03 ENCOUNTER — Telehealth (HOSPITAL_COMMUNITY): Payer: Self-pay

## 2020-01-03 LAB — GC/CHLAMYDIA PROBE AMP (~~LOC~~) NOT AT ARMC
Chlamydia: NEGATIVE
Neisseria Gonorrhea: NEGATIVE

## 2020-01-03 LAB — URINE CULTURE: Culture: >=100000 — AB

## 2020-01-04 ENCOUNTER — Telehealth: Payer: Self-pay

## 2020-01-04 NOTE — Telephone Encounter (Signed)
Post ED Visit - Positive Culture Follow-up  Culture report reviewed by antimicrobial stewardship pharmacist: [facility] []  , Pharm.D. []  Enzo Bi, Pharm.D., BCPS AQ-ID []  , Pharm.D., BCPS []  Celedonio Miyamoto, Pharm.D., BCPS []  [place], Garvin Fila.D., BCPS, AAHIVP []  , Pharm.D., BCPS, AAHIVP []  Georgina Pillion, PharmD, BCPS []  , PharmD, BCPS []  [place], PharmD, BCPS []  [address], PharmD []  , PharmD, BCPS []  Estella Husk, PharmD [facility] []  Lysle Pearl, PharmD []  , PharmD []  Phillips Climes, PharmD []  , Rph []  Agapito Games) , PharmD []  Verlan Friends, PharmD []  , PharmD []  Mervyn Gay, PharmD []  , PharmD []  Vinnie Level, PharmD []  Bufford Lope, PharmD []  , PharmD []  Len Childs, PharmD   Positive urine culture  and no further patient follow-up is required at this time.  01/04/2020, 9:29 AM

## 2021-08-14 ENCOUNTER — Other Ambulatory Visit: Payer: Self-pay

## 2021-08-14 ENCOUNTER — Encounter (HOSPITAL_BASED_OUTPATIENT_CLINIC_OR_DEPARTMENT_OTHER): Payer: Self-pay

## 2021-08-14 ENCOUNTER — Emergency Department (HOSPITAL_BASED_OUTPATIENT_CLINIC_OR_DEPARTMENT_OTHER): Payer: Medicaid Other

## 2021-08-14 ENCOUNTER — Emergency Department (HOSPITAL_BASED_OUTPATIENT_CLINIC_OR_DEPARTMENT_OTHER)
Admission: EM | Admit: 2021-08-14 | Discharge: 2021-08-15 | Disposition: A | Discharge status: Home / Self Care | Payer: Medicaid Other | Attending: Emergency Medicine | Admitting: Emergency Medicine

## 2021-08-14 DIAGNOSIS — R2242 Localized swelling, mass and lump, left lower limb: Secondary | ICD-10-CM | POA: Diagnosis not present

## 2021-08-14 DIAGNOSIS — M79662 Pain in left lower leg: Secondary | ICD-10-CM | POA: Insufficient documentation

## 2021-08-14 DIAGNOSIS — Z5321 Procedure and treatment not carried out due to patient leaving prior to being seen by health care provider: Secondary | ICD-10-CM | POA: Insufficient documentation

## 2021-08-14 DIAGNOSIS — Z87891 Personal history of nicotine dependence: Secondary | ICD-10-CM | POA: Insufficient documentation

## 2021-08-14 NOTE — ED Triage Notes (Addendum)
Pt c/o pain to left LE from knee to foot x 2 weeks-pain started after jogging-denies known trauma-states she noticed swelling to LE x today-NAD-limping gait

## 2021-08-15 ENCOUNTER — Encounter (HOSPITAL_BASED_OUTPATIENT_CLINIC_OR_DEPARTMENT_OTHER): Payer: Self-pay

## 2021-08-15 ENCOUNTER — Emergency Department (HOSPITAL_BASED_OUTPATIENT_CLINIC_OR_DEPARTMENT_OTHER)
Admission: EM | Admit: 2021-08-15 | Discharge: 2021-08-15 | Disposition: A | Discharge status: Home / Self Care | Payer: Medicaid Other | Source: Home / Self Care | Attending: Emergency Medicine | Admitting: Emergency Medicine

## 2021-08-15 ENCOUNTER — Emergency Department (HOSPITAL_BASED_OUTPATIENT_CLINIC_OR_DEPARTMENT_OTHER): Payer: Medicaid Other

## 2021-08-15 DIAGNOSIS — R2242 Localized swelling, mass and lump, left lower limb: Secondary | ICD-10-CM | POA: Insufficient documentation

## 2021-08-15 DIAGNOSIS — Z87891 Personal history of nicotine dependence: Secondary | ICD-10-CM | POA: Insufficient documentation

## 2021-08-15 DIAGNOSIS — M7989 Other specified soft tissue disorders: Secondary | ICD-10-CM

## 2021-08-15 DIAGNOSIS — M25562 Pain in left knee: Secondary | ICD-10-CM

## 2021-08-15 LAB — PREGNANCY, URINE: Preg Test, Ur: NEGATIVE

## 2021-08-15 MED ORDER — NAPROXEN 500 MG PO TABS: 500.0000 mg | ORAL_TABLET | Freq: Two times a day (BID) | ORAL | 0 refills | Status: AC | PRN

## 2021-08-15 NOTE — Discharge Instructions (Addendum)
You have been seen and discharged from the emergency department.  The x-ray of the knee was normal and the ultrasound of the leg showed no DVT.  Take pain medicine as needed.  Rest and elevate the leg.  Follow-up with your primary provider for reevaluation and further care. Take home medications as prescribed. If you have any worsening symptoms or further concerns for your health please return to an emergency department for further evaluation.

## 2021-08-15 NOTE — ED Triage Notes (Signed)
Left lower extremity pain x 2 weeks, just noticed swelling 2 days ago.

## 2021-08-15 NOTE — ED Provider Notes (Signed)
[facility] EMERGENCY DEPARTMENT Provider Note   CSN: [id number] Arrival date & time: 08/15/21  4742     History Chief Complaint  Patient presents with   Leg Pain   Leg Swelling    Erin [facility] is a 40 y.o. female.  HPI  40 year old female presents emergency department with left knee pain and left lower extremity swelling.  Patient states after going on a long run she had left knee pain, this improved and resolved however the past couple days left knee pain has been getting worse and now she has noticed swelling primarily in the left calf.  Denies any other injury to the leg.  No history of DVT, is not on oral hormone therapy.  Denies any numbness or tingling to the leg, no foot discoloration.  Pain does not radiate up into the buttocks or back.  History reviewed. No pertinent past medical history.  There are no problems to display for this patient.   History reviewed. No pertinent surgical history.   OB History   No obstetric history on file.     History reviewed. No pertinent family history.  Social History   Tobacco Use   Smoking status: Former    Packs/day: 0.50    Years: 6.00    Pack years: 3.00    Types: Cigarettes   Smokeless tobacco: Never  Vaping Use   Vaping Use: Never used  Substance Use Topics   Alcohol use: Yes    Comment: occ   Drug use: No    Home Medications Prior to Admission medications   Medication Sig Start Date End Date Taking? Authorizing Provider  cyclobenzaprine (FLEXERIL) 10 MG tablet Take 1 tablet (10 mg total) by mouth 3 (three) times daily as needed for muscle spasms. 06/09/16   Street, [place], PA-C  naproxen (NAPROSYN) 500 MG tablet Take 1 tablet (500 mg total) by mouth 2 (two) times daily as needed for mild pain, moderate pain or headache (TAKE WITH MEALS.). 06/09/16   Street, [place], PA-C    Allergies    Patient has no known allergies.  Review of Systems   Review of Systems  Constitutional:  Negative for  chills and fever.  Respiratory:  Negative for shortness of breath.   Cardiovascular:  Negative for chest pain.  Gastrointestinal:  Negative for abdominal pain, diarrhea and vomiting.  Genitourinary:  Negative for dysuria.  Musculoskeletal:  Negative for back pain.       + Left knee pain and left calf swelling  Skin:  Negative for rash.  Neurological:  Negative for weakness, numbness and headaches.   Physical Exam Updated Vital Signs BP 118/73 (BP Location: Right Arm)   Pulse 67   Temp 98.1 F (36.7 C) (Oral)   Resp 18   Ht 5\' 6"  (1.676 m)   Wt 96.6 kg   LMP 08/02/2021   SpO2 97%   BMI 34.37 kg/m   Physical Exam Vitals and nursing note reviewed.  Constitutional:      Appearance: Normal appearance.  HENT:     Head: Normocephalic.     Mouth/Throat:     Mouth: Mucous membranes are moist.  Cardiovascular:     Rate and Rhythm: Normal rate.  Pulmonary:     Effort: Pulmonary effort is normal. No respiratory distress.  Abdominal:     Palpations: Abdomen is soft.     Tenderness: There is no abdominal tenderness.  Musculoskeletal:     Comments: Mild tenderness to palpation of the left knee Without any  overlying swelling/deformity, left calf does not look objectively swollen, is nontender, no overlying skin changes, foot is neurovascularly intact.  Skin:    General: Skin is warm.  Neurological:     Mental Status: She is alert and oriented to person, place, and time. Mental status is at baseline.  Psychiatric:        Mood and Affect: Mood normal.    ED Results / Procedures / Treatments   Labs (all labs ordered are listed, but only abnormal results are displayed) Labs Reviewed  PREGNANCY, URINE    EKG None  Radiology No results found.  Procedures Procedures   Medications Ordered in ED Medications - No data to display  ED Course  I have reviewed the triage vital signs and the nursing notes.  Pertinent labs & imaging results that were available during my care of  the patient were reviewed by me and considered in my medical decision making (see chart for details).    MDM Rules/Calculators/A&P                           40 year old female presents emergency department with left knee pain and subjective left calf swelling.  This is been going on for 2 weeks, stemming after long exercise.  The knee itself was unremarkable, stable, no laxity.  The leg is neurovascularly intact.  No significant swelling of the left calf, no discoloration.  The leg is vascularly intact.  X-ray is unremarkable, ultrasound shows no DVT.  Patient will be treated symptomatically and referred outpatient.  Patient at this time appears safe and stable for discharge and will be treated as an outpatient.  Discharge plan and strict return to ED precautions discussed, patient verbalizes understanding and agreement.  Final Clinical Impression(s) / ED Diagnoses Final diagnoses:  None    Rx / DC Orders ED Discharge Orders     None        Rozelle Logan, DO 08/15/21 1121

## 2021-11-22 ENCOUNTER — Encounter (HOSPITAL_BASED_OUTPATIENT_CLINIC_OR_DEPARTMENT_OTHER): Payer: Self-pay | Admitting: *Deleted

## 2021-11-22 ENCOUNTER — Emergency Department (HOSPITAL_BASED_OUTPATIENT_CLINIC_OR_DEPARTMENT_OTHER)
Admission: EM | Admit: 2021-11-22 | Discharge: 2021-11-22 | Disposition: A | Discharge status: Home / Self Care | Payer: Medicaid Other | Attending: Emergency Medicine | Admitting: Emergency Medicine

## 2021-11-22 ENCOUNTER — Other Ambulatory Visit: Payer: Self-pay

## 2021-11-22 DIAGNOSIS — J029 Acute pharyngitis, unspecified: Secondary | ICD-10-CM | POA: Diagnosis present

## 2021-11-22 DIAGNOSIS — J020 Streptococcal pharyngitis: Secondary | ICD-10-CM | POA: Insufficient documentation

## 2021-11-22 DIAGNOSIS — U071 COVID-19: Secondary | ICD-10-CM | POA: Diagnosis not present

## 2021-11-22 LAB — RESP PANEL BY RT-PCR (FLU A&B, COVID) ARPGX2
Influenza A by PCR: NEGATIVE
Influenza B by PCR: NEGATIVE
SARS Coronavirus 2 by RT PCR: POSITIVE — AB

## 2021-11-22 LAB — GROUP A STREP BY PCR: Group A Strep by PCR: DETECTED — AB

## 2021-11-22 MED ORDER — CETIRIZINE HCL 10 MG PO TABS: 10.0000 mg | ORAL_TABLET | Freq: Every day | ORAL | 0 refills | Status: DC

## 2021-11-22 MED ORDER — FLUTICASONE PROPIONATE 50 MCG/ACT NA SUSP: 2.0000 | Freq: Every day | NASAL | 0 refills | Status: DC

## 2021-11-22 MED ORDER — NAPROXEN 500 MG PO TABS: 500.0000 mg | ORAL_TABLET | Freq: Two times a day (BID) | ORAL | 0 refills | Status: DC

## 2021-11-22 MED ORDER — PENICILLIN G BENZATHINE 1200000 UNIT/2ML IM SUSY
1.2000 10*6.[IU] | PREFILLED_SYRINGE | Freq: Once | INTRAMUSCULAR | Status: AC
  Administered 2021-11-22: 1.2 10*6.[IU] via INTRAMUSCULAR
  Filled 2021-11-22: qty 2

## 2021-11-22 MED ORDER — LIDOCAINE VISCOUS HCL 2 % MT SOLN: 15.0000 mL | Freq: Four times a day (QID) | OROMUCOSAL | 0 refills | Status: AC | PRN

## 2021-11-22 MED ORDER — BENZONATATE 100 MG PO CAPS: 100.0000 mg | ORAL_CAPSULE | Freq: Three times a day (TID) | ORAL | 0 refills | Status: DC

## 2021-11-22 MED ORDER — LIDOCAINE VISCOUS HCL 2 % MT SOLN
15.0000 mL | Freq: Once | OROMUCOSAL | Status: AC
  Administered 2021-11-22: 15 mL via OROMUCOSAL
  Filled 2021-11-22: qty 15

## 2021-11-22 MED ORDER — LIDOCAINE VISCOUS HCL 2 % MT SOLN: 15.0000 mL | Freq: Four times a day (QID) | OROMUCOSAL | 0 refills | Status: DC | PRN

## 2021-11-22 MED ORDER — CLINDAMYCIN HCL 150 MG PO CAPS
300.0000 mg | ORAL_CAPSULE | Freq: Once | ORAL | Status: AC
  Administered 2021-11-22: 300 mg via ORAL
  Filled 2021-11-22: qty 2

## 2021-11-22 MED ORDER — DEXAMETHASONE 4 MG PO TABS
8.0000 mg | ORAL_TABLET | Freq: Once | ORAL | Status: AC
  Administered 2021-11-22: 8 mg via ORAL
  Filled 2021-11-22: qty 2

## 2021-11-22 MED ORDER — KETOROLAC TROMETHAMINE 30 MG/ML IJ SOLN
30.0000 mg | Freq: Once | INTRAMUSCULAR | Status: AC
  Administered 2021-11-22: 30 mg via INTRAMUSCULAR
  Filled 2021-11-22: qty 1

## 2021-11-22 NOTE — ED Triage Notes (Addendum)
C/o sore throat x 3 days, denies any other sx

## 2021-11-22 NOTE — Discharge Instructions (Addendum)
Your strep test and COVID test were positive.  This is likely the cause of your symptoms.  We have given you an antibiotic shot here in the emergency department.  I have written you for a few medications to help with your symptoms at home as well.  Tessalon Perles for cough Flonase for congestion and runny nose Zyrtec for congestion Lidocaine solution for sore throat Naprosyn to help with swelling to her throat.  Do not take any additional ibuprofen containing medication with this  Return for new or worsening symptoms

## 2021-11-22 NOTE — ED Provider Notes (Signed)
Erin [facility] Provider Note   CSN: [id number] Arrival date & time: 11/22/21  1315     History  Chief Complaint  Patient presents with   Sore Throat    Erin Savage is a 41 y.o. female with a past medical history here for evaluation of sore throat x3 days.  Hurts to swallow.  Chills at home without documented fever.  Denies any cough however coughing in room.  Sounds congested.  No vomiting, headache, neck pain, neck stiffness, lower extremity swelling  HPI     Home Medications Prior to Admission medications   Medication Sig Start Date End Date Taking? Authorizing Provider  benzonatate (TESSALON) 100 MG capsule Take 1 capsule (100 mg total) by mouth every 8 (eight) hours. 11/22/21  Yes Lorrie Strauch A, PA-C  cetirizine (ZYRTEC ALLERGY) 10 MG tablet Take 1 tablet (10 mg total) by mouth daily. 11/22/21  Yes Ishita Mcnerney A, PA-C  fluticasone (FLONASE) 50 MCG/ACT nasal spray Place 2 sprays into both nostrils daily. 11/22/21  Yes Gerrit Rafalski A, PA-C  lidocaine (XYLOCAINE) 2 % solution Use as directed 15 mLs in the mouth or throat every 6 (six) hours as needed for up to 3 days for mouth pain. 11/22/21 11/25/21 Yes Ziyonna Christner A, PA-C  naproxen (NAPROSYN) 500 MG tablet Take 1 tablet (500 mg total) by mouth 2 (two) times daily. 11/22/21  Yes Noe Pittsley A, PA-C  cyclobenzaprine (FLEXERIL) 10 MG tablet Take 1 tablet (10 mg total) by mouth 3 (three) times daily as needed for muscle spasms. 06/09/16   Street, [place], PA-C      Allergies    Patient has no known allergies.    Review of Systems   Review of Systems  Constitutional: Negative.   HENT:  Positive for congestion, postnasal drip and sore throat. Negative for trouble swallowing and voice change.   Respiratory:  Positive for cough.   Cardiovascular: Negative.   Gastrointestinal: Negative.   Genitourinary: Negative.   Musculoskeletal: Negative.   Skin: Negative.   Neurological:  Negative.   All other systems reviewed and are negative.  Physical Exam Updated Vital Signs BP 128/85 (BP Location: Right Arm)    Pulse 72    Temp 99.6 F (37.6 C) (Oral)    Resp 17    Ht 5\' 5"  (1.651 m)    Wt 92.5 kg    LMP 11/13/2021    SpO2 95%    BMI 33.95 kg/m  Physical Exam Vitals and nursing note reviewed.  Constitutional:      General: She is not in acute distress.    Appearance: She is well-developed. She is not ill-appearing, toxic-appearing or diaphoretic.  HENT:     Head: Normocephalic and atraumatic.     Nose: Rhinorrhea present. No congestion.     Mouth/Throat:     Tonsils: Tonsillar exudate present. No tonsillar abscesses. 2+ on the right. 2+ on the left.     Comments: PO erythematous, tonsils 2+ bilaterally with exudate.  Uvula midline.  No deviation.  No pooling of secretions.  No obvious PTA or RPA Eyes:     Conjunctiva/sclera: Conjunctivae normal.     Pupils: Pupils are equal, round, and reactive to light.  Neck:     Comments: Full range of motion, nontender.  No rigidity. Cardiovascular:     Rate and Rhythm: Normal rate.     Heart sounds: Normal heart sounds.  Pulmonary:     Effort: Pulmonary effort is normal. No respiratory distress.  Breath sounds: Normal breath sounds.  Abdominal:     General: There is no distension.  Musculoskeletal:        General: Normal range of motion.     Cervical back: Normal range of motion.  Skin:    General: Skin is warm and dry.     Capillary Refill: Capillary refill takes less than 2 seconds.  Neurological:     General: No focal deficit present.     Mental Status: She is alert.  Psychiatric:        Mood and Affect: Mood normal.    ED Results / Procedures / Treatments   Labs (all labs ordered are listed, but only abnormal results are displayed) Labs Reviewed  GROUP A STREP BY PCR - Abnormal; Notable for the following components:      Result Value   Group A Strep by PCR DETECTED (*)    All other components within  normal limits  RESP PANEL BY RT-PCR (FLU A&B, COVID) ARPGX2 - Abnormal; Notable for the following components:   SARS Coronavirus 2 by RT PCR POSITIVE (*)    All other components within normal limits    EKG None  Radiology No results found.  Procedures Procedures    Medications Ordered in ED Medications  ketorolac (TORADOL) 30 MG/ML injection 30 mg (30 mg Intramuscular Given 11/22/21 1404)  dexamethasone (DECADRON) tablet 8 mg (8 mg Oral Given 11/22/21 1403)  lidocaine (XYLOCAINE) 2 % viscous mouth solution 15 mL (15 mLs Mouth/Throat Given 11/22/21 1404)  clindamycin (CLEOCIN) capsule 300 mg (300 mg Oral Given 11/22/21 1403)  penicillin g benzathine (BICILLIN LA) 1200000 UNIT/2ML injection 1.2 Million Units (1.2 Million Units Intramuscular Given 11/22/21 1437)    ED Course/ Medical Decision Making/ A&P                            41 year old here for evaluation of sore throat.  Actively coughing in room.  Sounds congested.  She has no neck stiffness or neck rigidity.  She has no meningismus.  Heart and lungs clear.  Posterior oropharynx erythematous, tonsils 2+ bilaterally with exudate, erythema.  Uvula midline.  No pooling of secretions.  No evidence of PTA or RPA.  No voice changes.  Labs personally reviewed and interpreted COVID positive Strep pharyngitis positive  Discussed results with patient in room.  Treated with IM Toradol, Decadron, viscous lidocaine.  She is tolerating p.o. intake.  Initially patient had elected for p.o. antibiotics, given here however eventually settled on IM Bicillin.  Given here without difficulty  Discussed OTC medications to help with symptoms.  Follow-up with PCP, return for new or worsening symptoms.  The patient has been appropriately medically screened and/or stabilized in the ED. I have low suspicion for any other emergent medical condition which would require further screening, evaluation or treatment in the ED or require inpatient  management.  Patient is hemodynamically stable and in no acute distress.  Patient able to ambulate in [place] prior to ED.  Evaluation does not show acute pathology that would require ongoing or additional emergent interventions while in the emergency [place] or further inpatient treatment.  I have discussed the diagnosis with the patient and answered all questions.  Pain is been managed while in the emergency [place] and patient has no further complaints prior to discharge.  Patient is comfortable with plan discussed in room and is stable for discharge at this time.  I have discussed strict return precautions  for returning to the emergency [place].  Patient was encouraged to follow-up with PCP/specialist refer to at discharge.   Medical Decision Making Amount and/or Complexity of Data Reviewed Labs: ordered. Decision-making details documented in ED Course.  Risk OTC drugs. Prescription drug management. Drug therapy requiring intensive monitoring for toxicity.           Final Clinical Impression(s) / ED Diagnoses Final diagnoses:  COVID  Strep pharyngitis    Rx / DC Orders ED Discharge Orders          Ordered    benzonatate (TESSALON) 100 MG capsule  Every 8 hours        11/22/21 1441    fluticasone (FLONASE) 50 MCG/ACT nasal spray  Daily        11/22/21 1441    cetirizine (ZYRTEC ALLERGY) 10 MG tablet  Daily        11/22/21 1441    lidocaine (XYLOCAINE) 2 % solution  Every 6 hours PRN        11/22/21 1441    naproxen (NAPROSYN) 500 MG tablet  2 times daily        11/22/21 1441              Olla Delancey A, PA-C 11/22/21 1442    Blanchie Dessert, MD 11/23/21 [phone]

## 2022-05-17 IMAGING — CR DG KNEE COMPLETE 4+V*L*
4 series · 4 of 4 positions shown · non-contrast
Comparison: May 21, 2019.

CLINICAL DATA: Left knee pain and swelling for 3 weeks.

EXAM:
LEFT KNEE - COMPLETE 4+ VIEW

[t knee ap left]
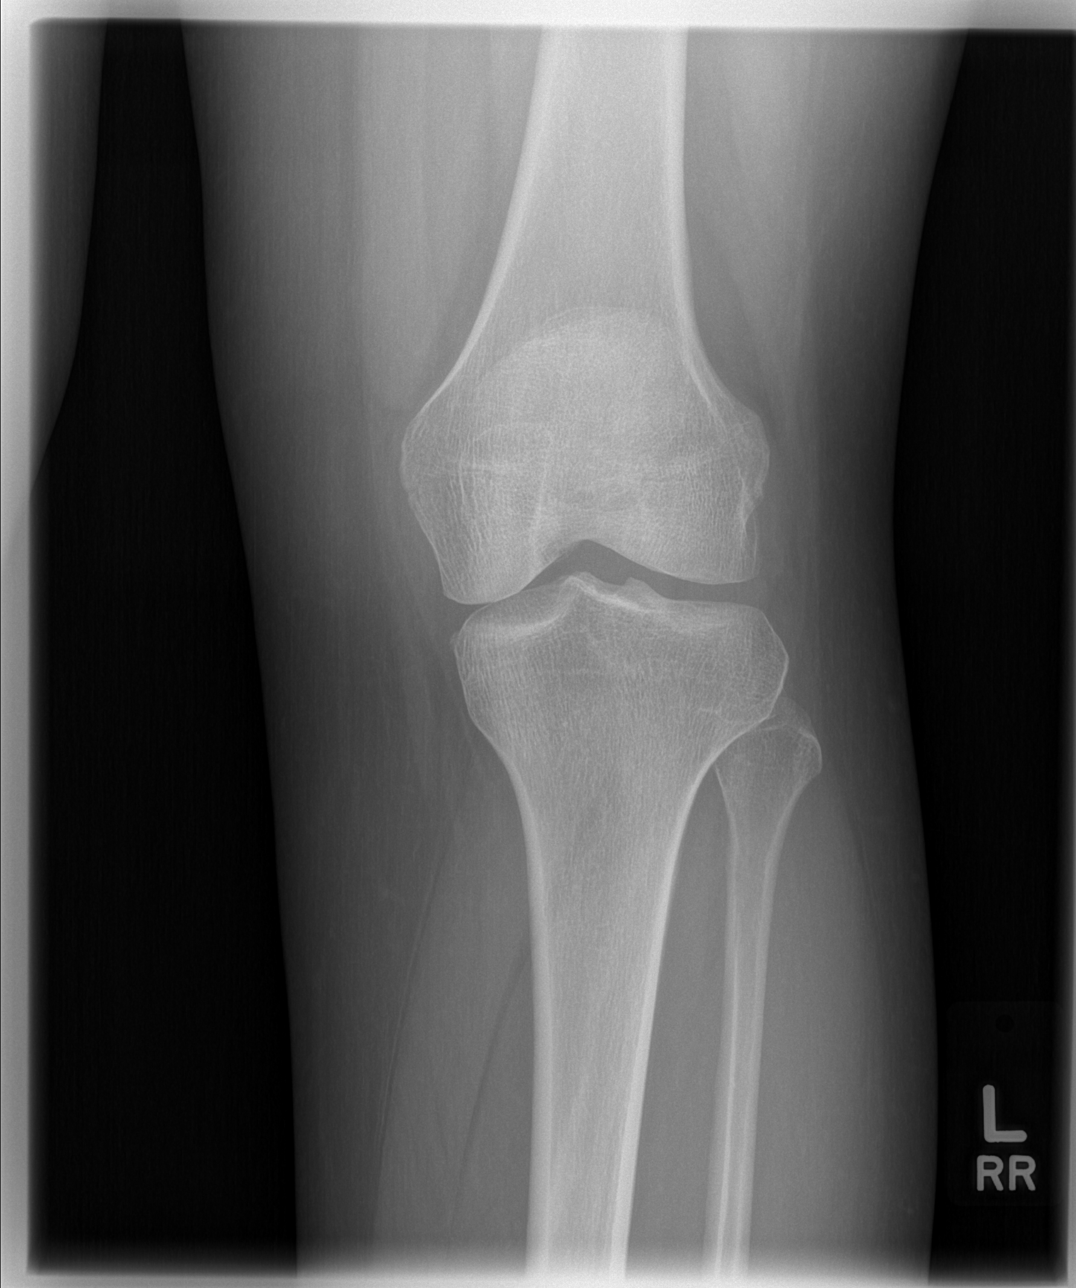

[t knee oblique left (1 of 2)]
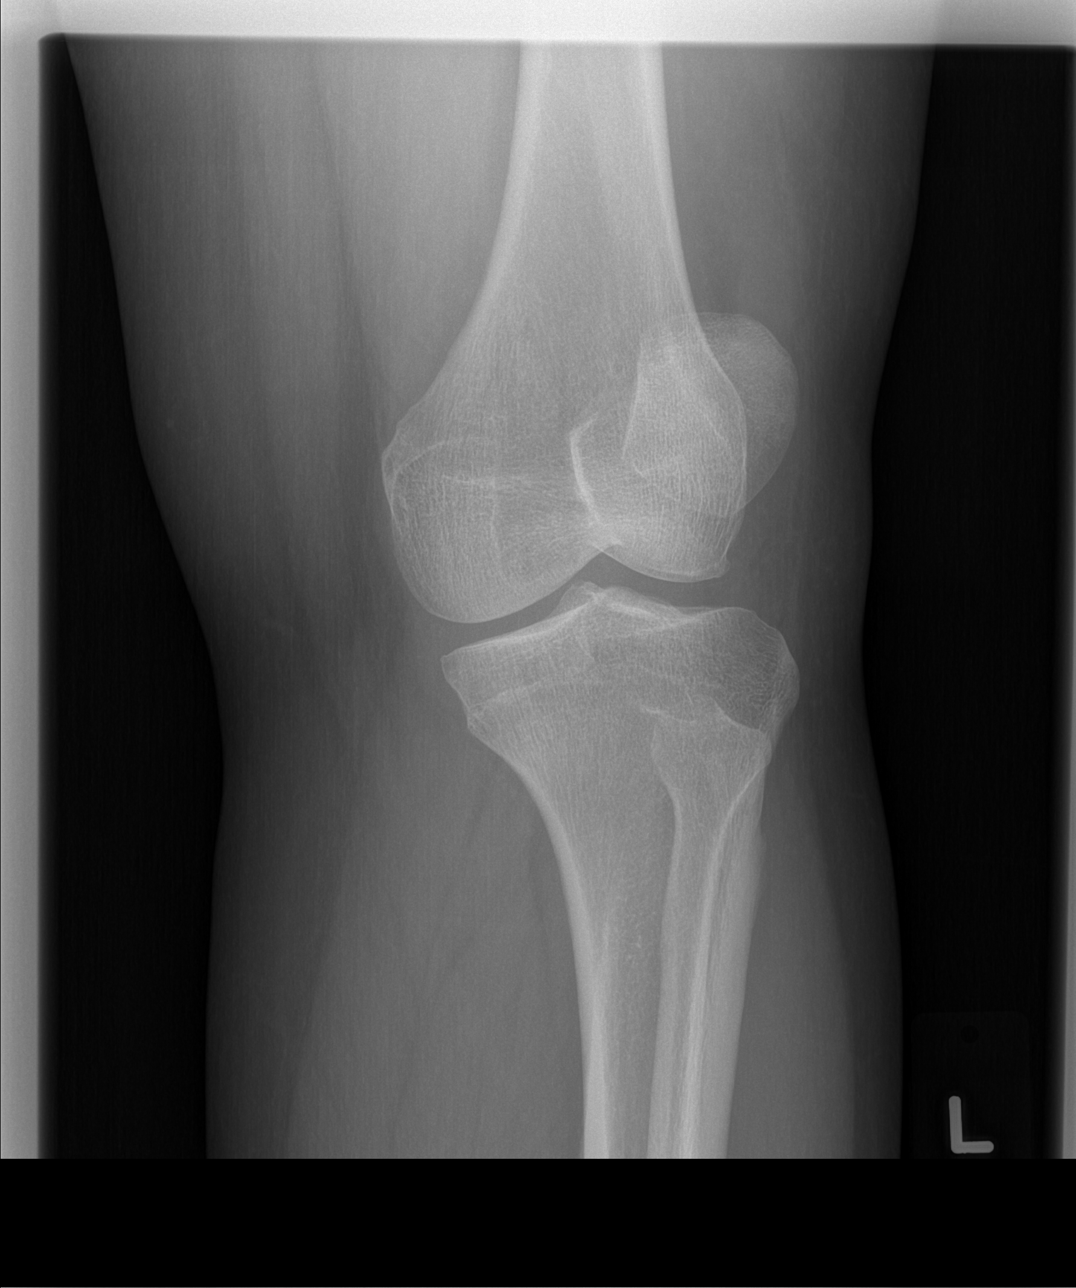

[t knee oblique left (2 of 2)]
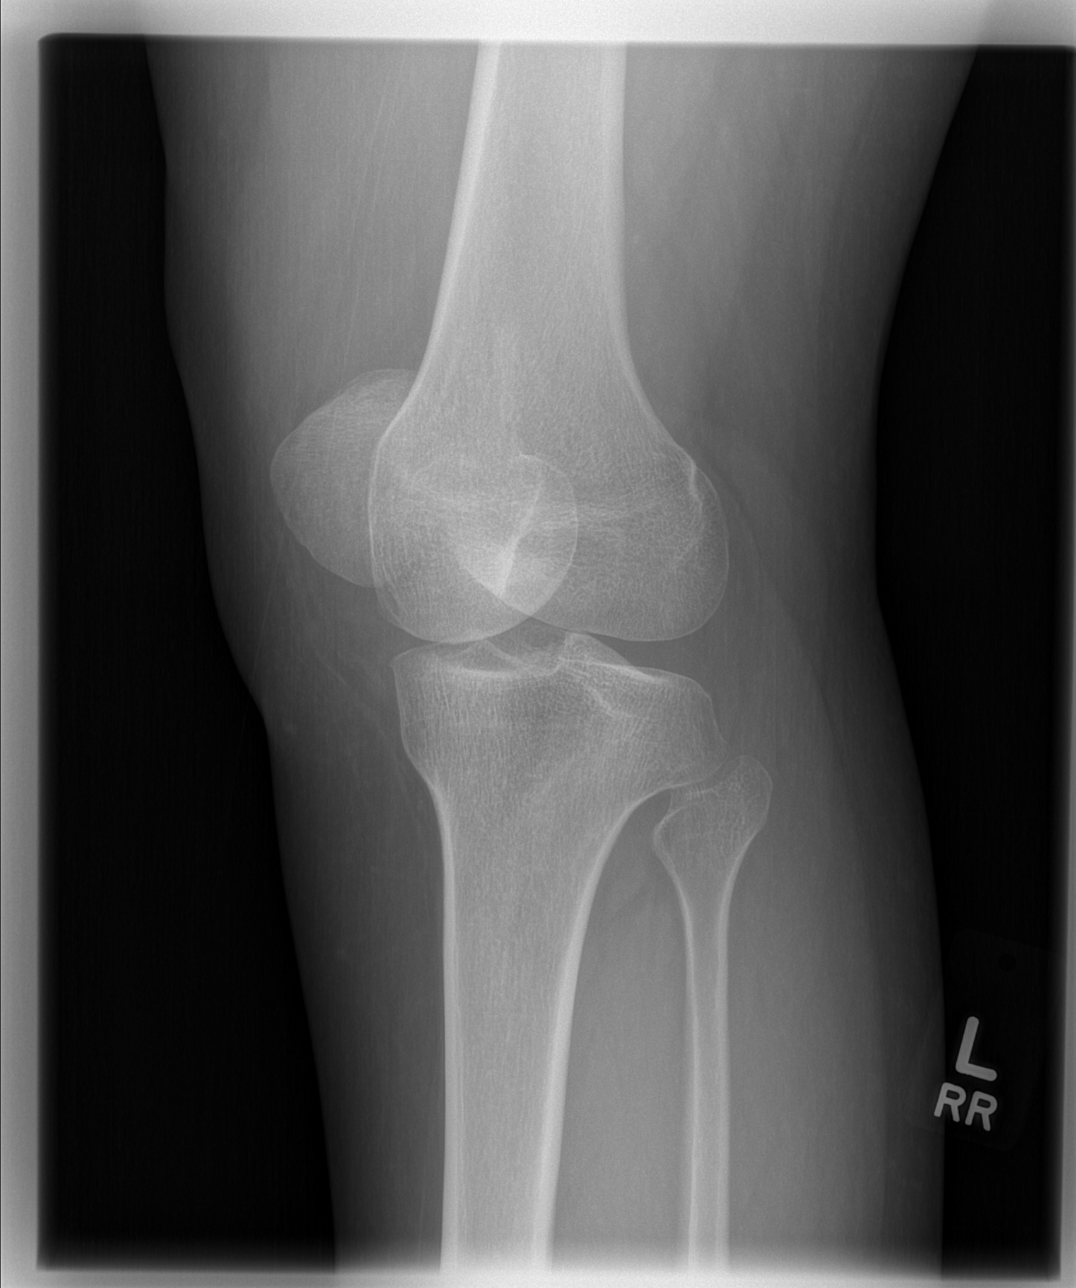

[t knee lat left]
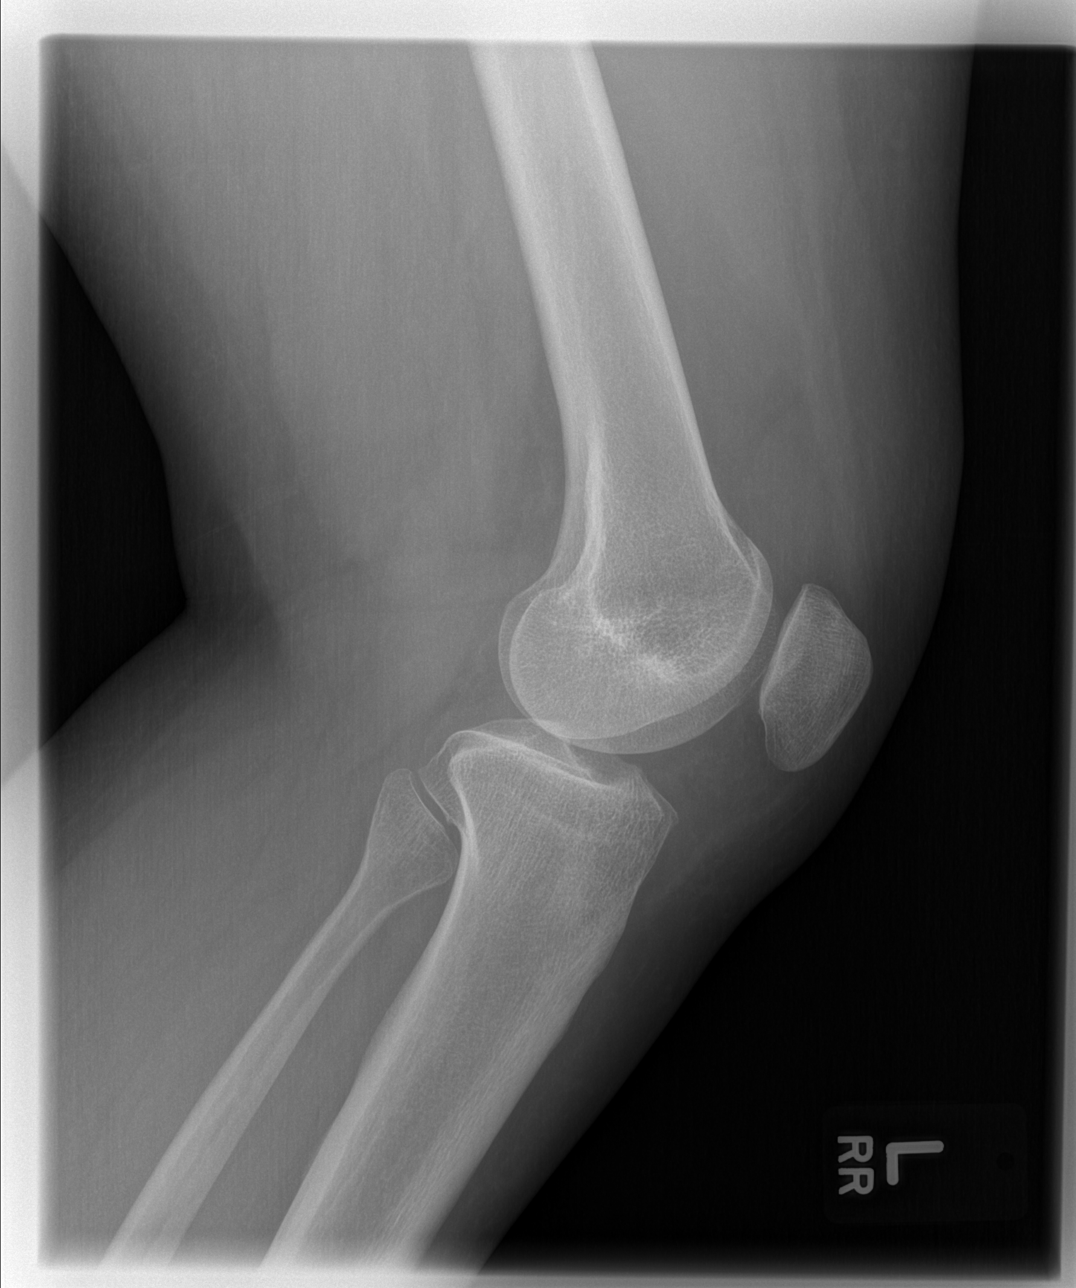

[4 of 4 positions shown; findings below may reference images not displayed]

FINDINGS: No evidence of fracture, dislocation, or joint effusion. No evidence
of arthropathy or other focal bone abnormality. Soft tissues are
unremarkable.
IMPRESSION: Negative.

## 2022-05-17 IMAGING — US US EXTREM LOW VENOUS*L*
1 series · 14 of 24 positions shown · non-contrast
Comparison: None

CLINICAL DATA: A 39-year-old female presents with pain and edema to
the LEFT lower extremity.

EXAM:
LEFT LOWER EXTREMITY VENOUS DOPPLER ULTRASOUND
TECHNIQUE: Gray-scale sonography with compression, as well as color and duplex
ultrasound, were performed to evaluate the deep venous system(s)
from the level of the common femoral vein through the popliteal and
proximal calf veins.

[Series 1: us extrem low venous*left* · 14 of 42 slices shown]
[im 1/42]
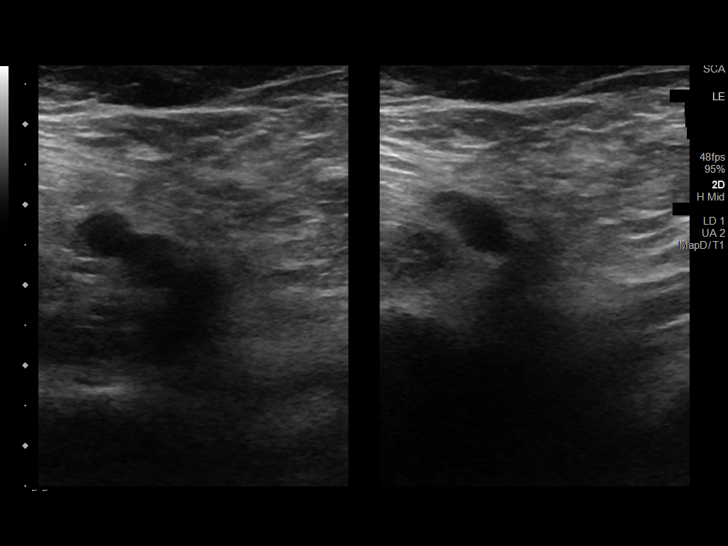
[im 4/42]
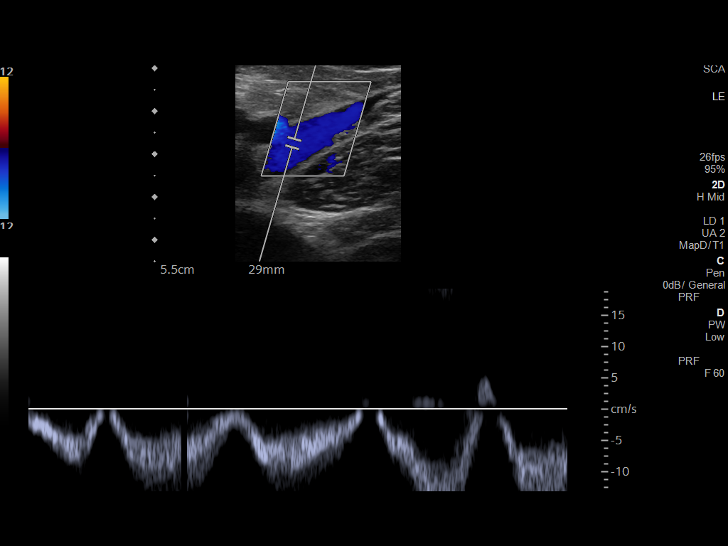
[im 8/42]
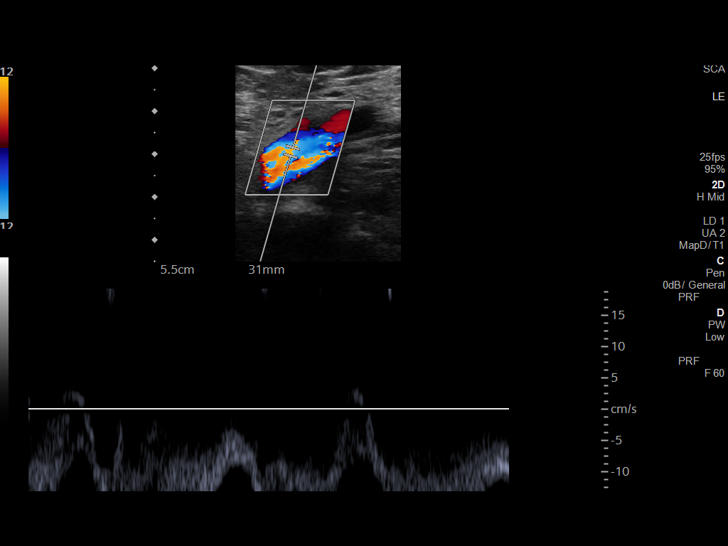
[im 11/42]
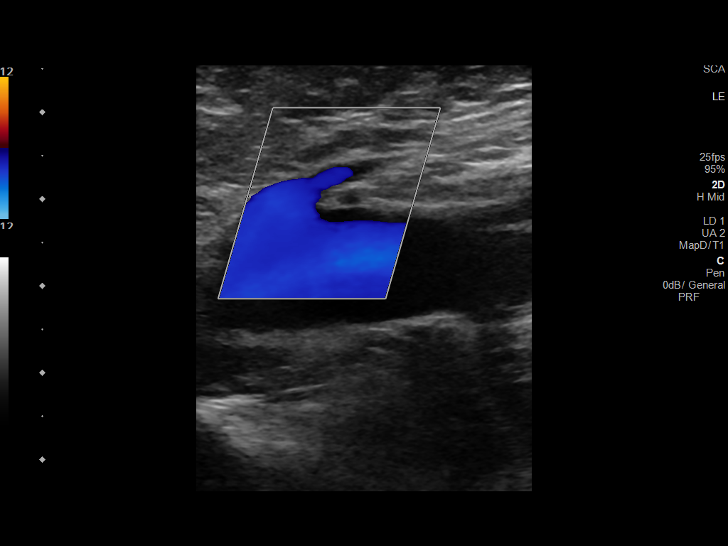
[im 13/42]
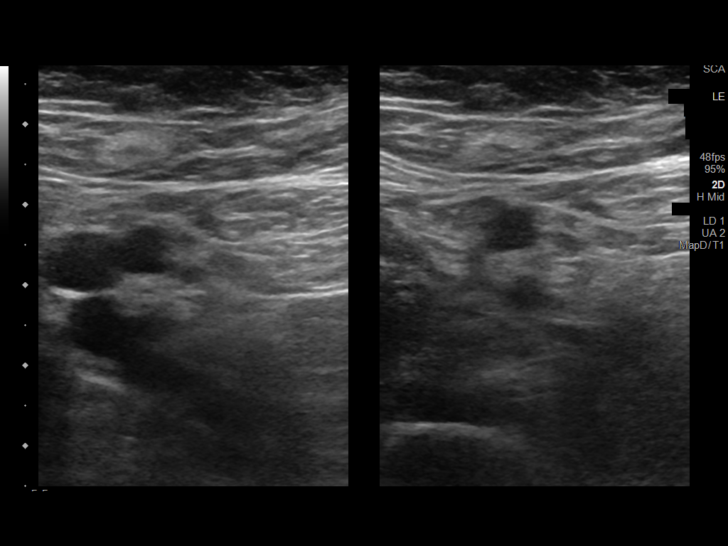
[im 17/42]
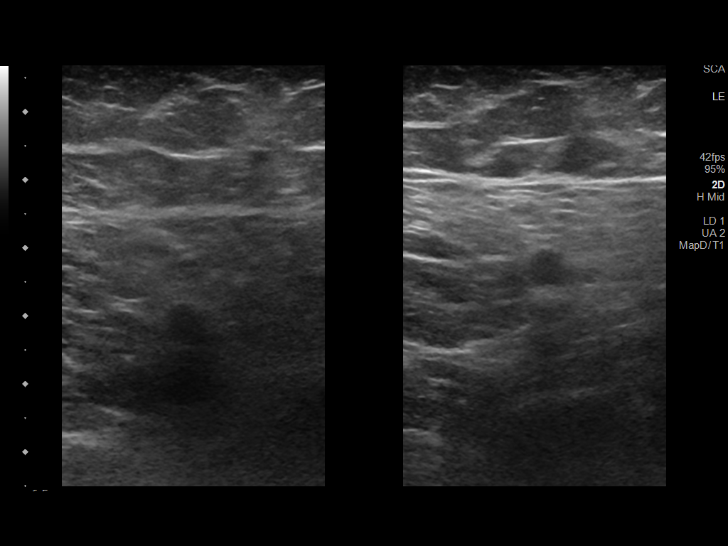
[im 20/42]
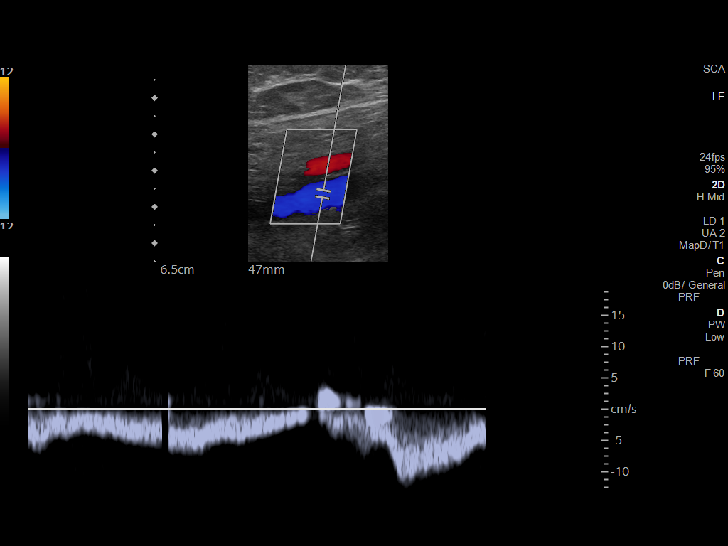
[im 22/42]
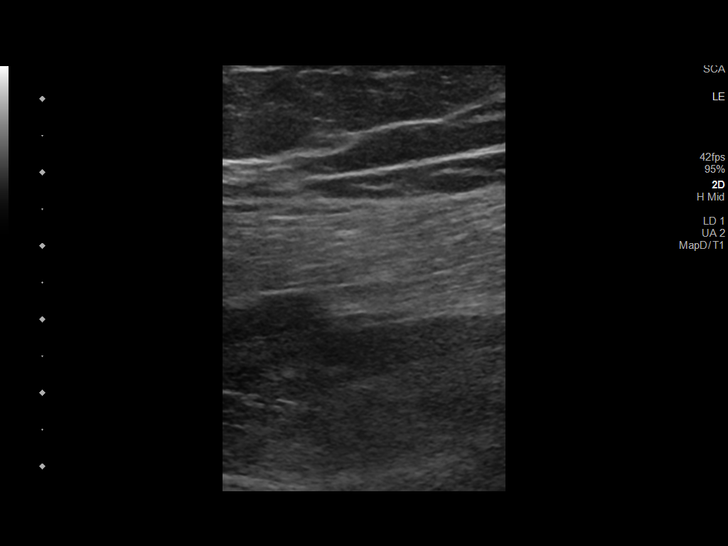
[im 25/42]
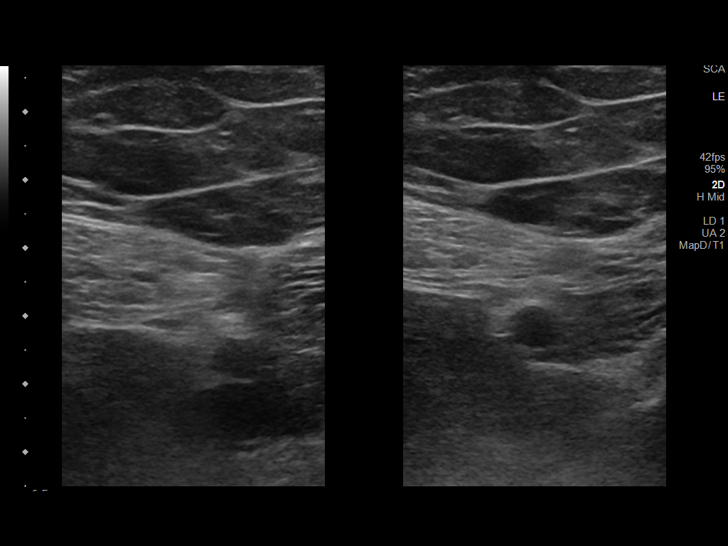
[im 29/42]
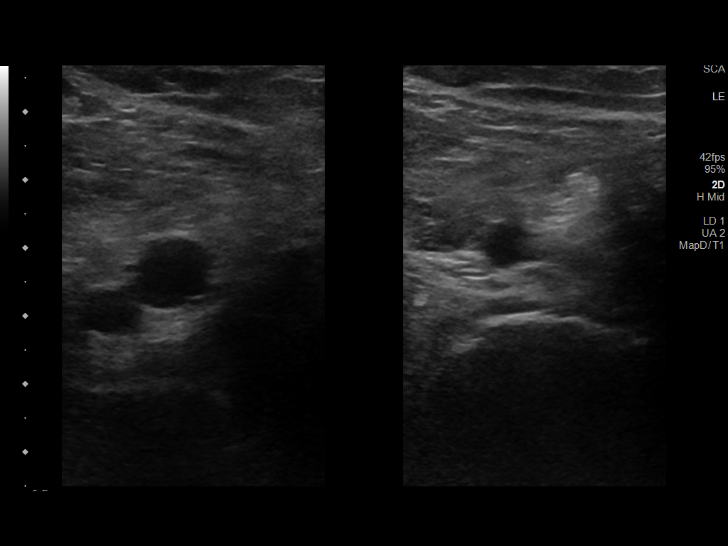
[im 33/42]
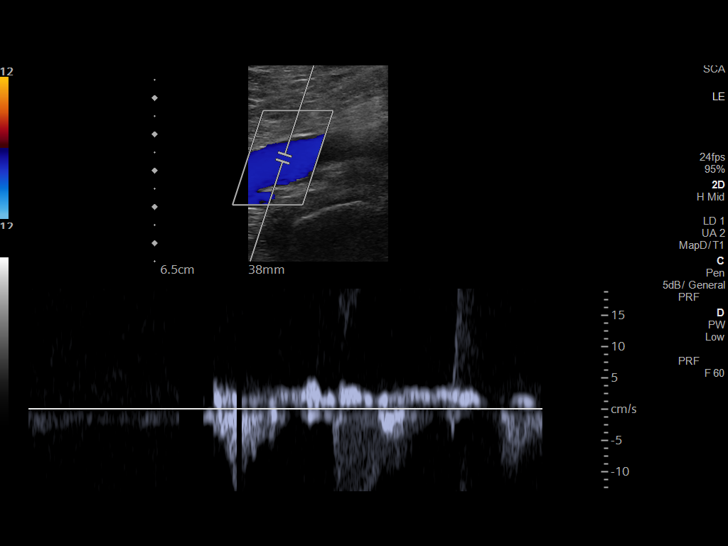
[im 34/42]
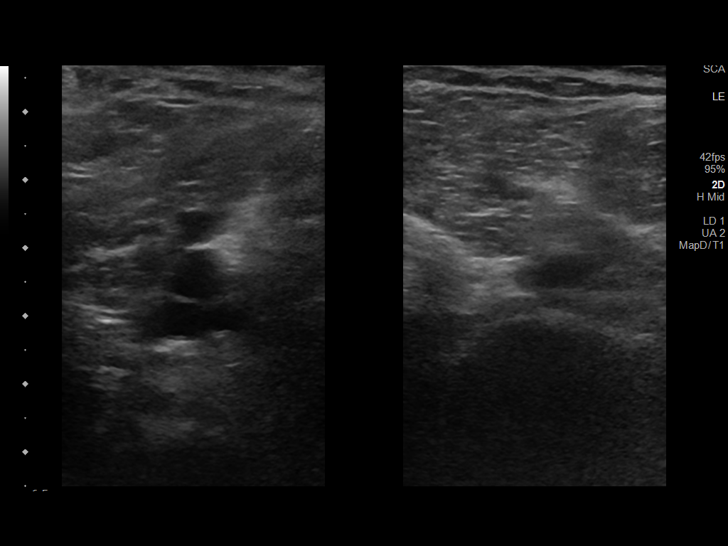
[im 38/42]
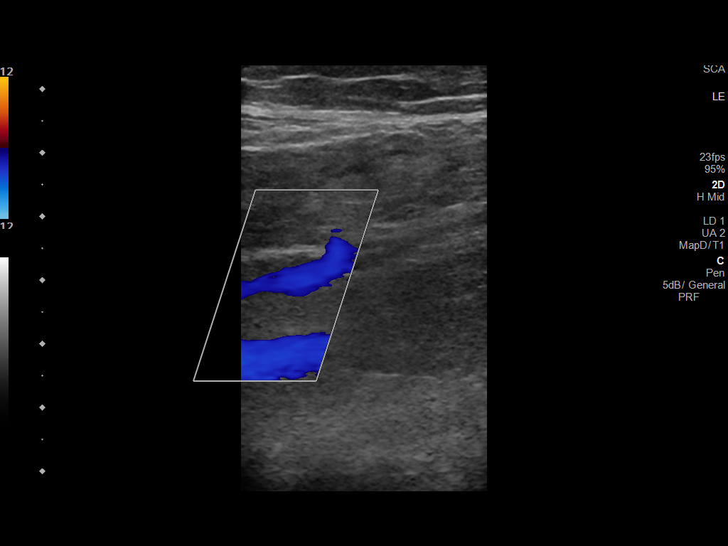
[im 42/42]
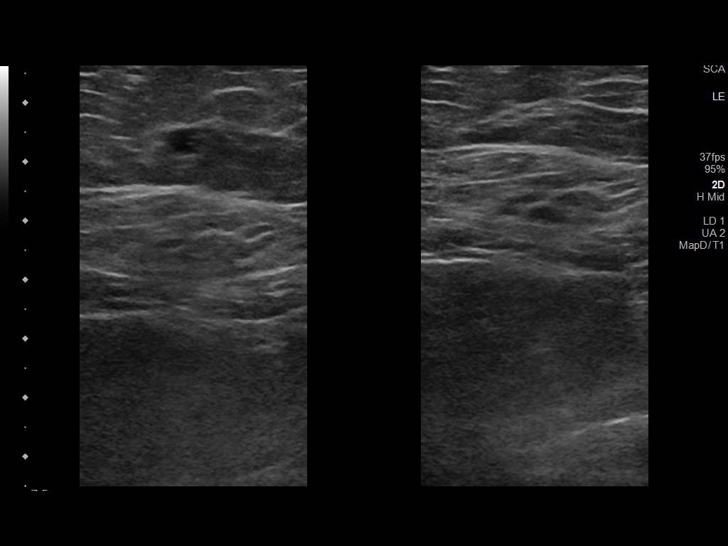

[14 of 24 positions shown; findings below may reference images not displayed]

FINDINGS: VENOUS

Normal compressibility of the common femoral, superficial femoral,
and popliteal veins, as well as the visualized calf veins.
Visualized portions of profunda femoral vein and great saphenous
vein unremarkable. No filling defects to suggest DVT on grayscale or
color Doppler imaging. Doppler waveforms show normal direction of
venous flow, normal respiratory plasticity and response to
augmentation.

Limited views of the contralateral common femoral vein are
unremarkable.

OTHER

None.

Limitations: none
IMPRESSION: No sonographic evidence of DVT in the LEFT lower extremity.

## 2023-04-26 ENCOUNTER — Encounter (HOSPITAL_BASED_OUTPATIENT_CLINIC_OR_DEPARTMENT_OTHER): Payer: Self-pay

## 2023-04-26 ENCOUNTER — Emergency Department (HOSPITAL_BASED_OUTPATIENT_CLINIC_OR_DEPARTMENT_OTHER)
Admission: EM | Admit: 2023-04-26 | Discharge: 2023-04-26 | Disposition: A | Discharge status: Home / Self Care | Payer: BC Managed Care – PPO | Attending: Emergency Medicine | Admitting: Emergency Medicine

## 2023-04-26 ENCOUNTER — Other Ambulatory Visit: Payer: Self-pay

## 2023-04-26 DIAGNOSIS — B9689 Other specified bacterial agents as the cause of diseases classified elsewhere: Secondary | ICD-10-CM

## 2023-04-26 DIAGNOSIS — Z113 Encounter for screening for infections with a predominantly sexual mode of transmission: Secondary | ICD-10-CM | POA: Insufficient documentation

## 2023-04-26 DIAGNOSIS — N760 Acute vaginitis: Secondary | ICD-10-CM | POA: Diagnosis not present

## 2023-04-26 DIAGNOSIS — N898 Other specified noninflammatory disorders of vagina: Secondary | ICD-10-CM | POA: Diagnosis not present

## 2023-04-26 LAB — URINALYSIS, ROUTINE W REFLEX MICROSCOPIC
Bilirubin Urine: NEGATIVE
Glucose, UA: NEGATIVE mg/dL
Hgb urine dipstick: NEGATIVE
Ketones, ur: NEGATIVE mg/dL
Leukocytes,Ua: NEGATIVE
Nitrite: NEGATIVE
Protein, ur: NEGATIVE mg/dL
Specific Gravity, Urine: 1.025 (ref 1.005–1.030)
pH: 5.5 (ref 5.0–8.0)

## 2023-04-26 LAB — WET PREP, GENITAL
Sperm: NONE SEEN
Trich, Wet Prep: NONE SEEN
WBC, Wet Prep HPF POC: <10 (ref <10)
Yeast Wet Prep HPF POC: NONE SEEN

## 2023-04-26 LAB — PREGNANCY, URINE: Preg Test, Ur: NEGATIVE

## 2023-04-26 LAB — HIV ANTIBODY (ROUTINE TESTING W REFLEX): HIV Screen 4th Generation wRfx: NONREACTIVE

## 2023-04-26 MED ORDER — ONDANSETRON 4 MG PO TBDP
4.0000 mg | ORAL_TABLET | Freq: Once | ORAL | Status: AC
Start: 2023-04-26 — End: 2023-04-26
  Administered 2023-04-26: 4 mg via ORAL
  Filled 2023-04-26: qty 1

## 2023-04-26 MED ORDER — CEFTRIAXONE SODIUM 500 MG IJ SOLR
500.0000 mg | Freq: Once | INTRAMUSCULAR | Status: AC
  Administered 2023-04-26: 500 mg via INTRAMUSCULAR
  Filled 2023-04-26: qty 500

## 2023-04-26 MED ORDER — ONDANSETRON 4 MG PO TBDP: 4.0000 mg | ORAL_TABLET | Freq: Three times a day (TID) | ORAL | 0 refills | Status: DC | PRN

## 2023-04-26 MED ORDER — LIDOCAINE HCL (PF) 1 % IJ SOLN
1.0000 mL | Freq: Once | INTRAMUSCULAR | Status: AC
  Administered 2023-04-26: 1 mL
  Filled 2023-04-26: qty 5

## 2023-04-26 MED ORDER — METRONIDAZOLE 500 MG PO TABS: 500.0000 mg | ORAL_TABLET | Freq: Two times a day (BID) | ORAL | 0 refills | Status: AC

## 2023-04-26 MED ORDER — DOXYCYCLINE HYCLATE 100 MG PO TABS
100.0000 mg | ORAL_TABLET | Freq: Once | ORAL | Status: AC
Start: 2023-04-26 — End: 2023-04-26
  Administered 2023-04-26: 100 mg via ORAL
  Filled 2023-04-26: qty 1

## 2023-04-26 MED ORDER — METRONIDAZOLE 500 MG PO TABS: 500.0000 mg | ORAL_TABLET | Freq: Once | ORAL | Status: DC

## 2023-04-26 MED ORDER — DOXYCYCLINE HYCLATE 100 MG PO CAPS: 100.0000 mg | ORAL_CAPSULE | Freq: Two times a day (BID) | ORAL | 0 refills | Status: AC

## 2023-04-26 MED ORDER — METRONIDAZOLE 500 MG PO TABS: 500.0000 mg | ORAL_TABLET | Freq: Two times a day (BID) | ORAL | 0 refills | Status: DC

## 2023-04-26 NOTE — ED Triage Notes (Addendum)
Pt reports mild discomfort in the lower abd and thinks she might have a bacterial infection in her vagina. Pt reports fishy odor. Pt reports unprotected sex recently.

## 2023-04-26 NOTE — Discharge Instructions (Addendum)
You were tested for STDs today.  Check your MyChart for the results of your testing performed today.  You should be called if chlamydia/gonorrhea test are positive.  Continue practicing safe sex until you know the results of the STD testing today.  You do have bacterial vaginosis which will be covered with Flagyl.  Do not take this medication with alcohol as it will make you extremely nauseous.  Continue the antibiotics as prescribed for empiric coverage of STDs.

## 2023-04-26 NOTE — ED Provider Notes (Signed)
[place] [facility] Provider Note   CSN: [id number] Arrival date & time: 04/26/23  4098     History  Chief Complaint  Patient presents with   Pelvic Pain    Erin Savage is a 42 y.o. female.  With no significant past medical history who presents with abnormal vaginal odor and wanting STD testing and treatment.  Patient has been sexually active without protection with a new partner recently.  She is not on any birth control.  Last menstrual period 1 week prior.  She has had no fevers, no chills, no nausea, no vomiting.  Has been having some abnormal clearing discharge but has had a fishy odor to it.  She says it similar to previous bacterial vaginosis.  Denies any history of chlamydia or gonorrhea.  Has no skin changes or lesions.  Endorses some mild lower pelvic discomfort.  No pain with urination or hematuria or flank tenderness.  HPI     Home Medications Prior to Admission medications   Medication Sig Start Date End Date Taking? Authorizing Provider  doxycycline (VIBRAMYCIN) 100 MG capsule Take 1 capsule (100 mg total) by mouth 2 (two) times daily for 7 days. 04/26/23 05/03/23 Yes Mardene Sayer, MD  ondansetron (ZOFRAN-ODT) 4 MG disintegrating tablet Take 1 tablet (4 mg total) by mouth every 8 (eight) hours as needed. 04/26/23  Yes Mardene Sayer, MD  benzonatate (TESSALON) 100 MG capsule Take 1 capsule (100 mg total) by mouth every 8 (eight) hours. 11/22/21   Henderly, Britni A, PA-C  cetirizine (ZYRTEC ALLERGY) 10 MG tablet Take 1 tablet (10 mg total) by mouth daily. 11/22/21   Henderly, Britni A, PA-C  cyclobenzaprine (FLEXERIL) 10 MG tablet Take 1 tablet (10 mg total) by mouth 3 (three) times daily as needed for muscle spasms. 06/09/16   Street, Mercedes, PA-C  fluticasone (FLONASE) 50 MCG/ACT nasal spray Place 2 sprays into both nostrils daily. 11/22/21   Henderly, Britni A, PA-C  metroNIDAZOLE (FLAGYL) 500 MG tablet Take 1 tablet (500  mg total) by mouth 2 (two) times daily for 14 doses. 04/26/23 05/03/23  Mardene Sayer, MD  naproxen (NAPROSYN) 500 MG tablet Take 1 tablet (500 mg total) by mouth 2 (two) times daily. 11/22/21   Henderly, Britni A, PA-C      Allergies    Patient has no known allergies.    Review of Systems   Review of Systems  Physical Exam Updated Vital Signs BP 134/83 (BP Location: Right Arm)   Pulse 63   Temp 98.2 F (36.8 C) (Oral)   Resp 18   Ht 5\' 6"  (1.676 m)   Wt 83.5 kg   LMP 04/19/2023 (Exact Date)   SpO2 100%   BMI 29.70 kg/m  Physical Exam Constitutional: Alert and oriented. Well appearing and in no distress. Eyes: Conjunctivae are normal. ENT      Head: Normocephalic and atraumatic. Cardiovascular: Regular rate Respiratory: Normal respiratory effort.  O2 sat 100 on RA Gastrointestinal: Soft and nontender. There is no CVA tenderness. Musculoskeletal: Normal range of motion in all extremities. Neurologic: Normal speech and language.  GCS 15.  AOx4. Skin: Skin is warm, dry and intact. No rash noted. Psychiatric: Mood and affect are normal. Speech and behavior are normal.  ED Results / Procedures / Treatments   Labs (all labs ordered are listed, but only abnormal results are displayed) Labs Reviewed  WET PREP, GENITAL - Abnormal; Notable for the following components:  Result Value   Clue Cells Wet Prep HPF POC PRESENT (*)    All other components within normal limits  URINALYSIS, ROUTINE W REFLEX MICROSCOPIC - Abnormal; Notable for the following components:   APPearance CLOUDY (*)    All other components within normal limits  PREGNANCY, URINE  RPR  HIV ANTIBODY (ROUTINE TESTING W REFLEX)  GC/CHLAMYDIA PROBE AMP ([place]) NOT AT [facility]    EKG None  Radiology No results found.  Procedures Procedures    Medications Ordered in ED Medications  cefTRIAXone (ROCEPHIN) injection 500 mg (500 mg Intramuscular Given 04/26/23 0652)  lidocaine (PF) (XYLOCAINE) 1 %  injection 1-2.1 mL (1 mL Other Given 04/26/23 1610)  doxycycline (VIBRA-TABS) tablet 100 mg (100 mg Oral Given 04/26/23 0652)  ondansetron (ZOFRAN-ODT) disintegrating tablet 4 mg (4 mg Oral Given 04/26/23 9604)    ED Course/ Medical Decision Making/ A&P                             Medical Decision Making Erin Savage is a 42 y.o. female.  With no significant past medical history who presents with abnormal vaginal odor and wanting STD testing and treatment.   Swab with + clue cells suggestive of BV. Patient's exam is benign and low suspicion for TOA or PID. Pregnancy negative.  Shared decision making with patient to empirically treat for STD with rocephin and doxycycline, continue flagyl for BV. Continue safe sex practices until she obtains results.  Amount and/or Complexity of Data Reviewed Labs: ordered.  Risk Prescription drug management.     Final Clinical Impression(s) / ED Diagnoses Final diagnoses:  Screening for STD (sexually transmitted disease)  BV (bacterial vaginosis)    Rx / DC Orders ED Discharge Orders          Ordered    doxycycline (VIBRAMYCIN) 100 MG capsule  2 times daily        04/26/23 0652    ondansetron (ZOFRAN-ODT) 4 MG disintegrating tablet  Every 8 hours PRN        04/26/23 0652    metroNIDAZOLE (FLAGYL) 500 MG tablet  2 times daily,   Status:  Discontinued        04/26/23 0704    metroNIDAZOLE (FLAGYL) 500 MG tablet  2 times daily        04/26/23 0707              Mardene Sayer, MD 04/26/23 [phone]

## 2023-04-27 LAB — GC/CHLAMYDIA PROBE AMP (~~LOC~~) NOT AT ARMC
Chlamydia: NEGATIVE
Comment: NEGATIVE
Comment: NORMAL
Neisseria Gonorrhea: NEGATIVE

## 2023-04-27 LAB — RPR: RPR Ser Ql: NONREACTIVE

## 2023-05-23 ENCOUNTER — Encounter (HOSPITAL_BASED_OUTPATIENT_CLINIC_OR_DEPARTMENT_OTHER): Payer: Self-pay | Admitting: Emergency Medicine

## 2023-05-23 ENCOUNTER — Other Ambulatory Visit: Payer: Self-pay

## 2023-05-23 ENCOUNTER — Emergency Department (HOSPITAL_BASED_OUTPATIENT_CLINIC_OR_DEPARTMENT_OTHER)
Admission: EM | Admit: 2023-05-23 | Discharge: 2023-05-23 | Disposition: A | Discharge status: Home / Self Care | Payer: BC Managed Care – PPO | Attending: Emergency Medicine | Admitting: Emergency Medicine

## 2023-05-23 DIAGNOSIS — Z113 Encounter for screening for infections with a predominantly sexual mode of transmission: Secondary | ICD-10-CM

## 2023-05-23 DIAGNOSIS — Z202 Contact with and (suspected) exposure to infections with a predominantly sexual mode of transmission: Secondary | ICD-10-CM | POA: Diagnosis not present

## 2023-05-23 LAB — URINALYSIS, ROUTINE W REFLEX MICROSCOPIC
Bilirubin Urine: NEGATIVE
Glucose, UA: NEGATIVE mg/dL
Ketones, ur: NEGATIVE mg/dL
Nitrite: NEGATIVE
Protein, ur: NEGATIVE mg/dL
Specific Gravity, Urine: 1.02 (ref 1.005–1.030)
pH: 6.5 (ref 5.0–8.0)

## 2023-05-23 LAB — URINALYSIS, MICROSCOPIC (REFLEX)

## 2023-05-23 LAB — PREGNANCY, URINE: Preg Test, Ur: NEGATIVE

## 2023-05-23 MED ORDER — FLUCONAZOLE 150 MG PO TABS
150.0000 mg | ORAL_TABLET | Freq: Once | ORAL | Status: AC
  Administered 2023-05-23: 150 mg via ORAL
  Filled 2023-05-23: qty 1

## 2023-05-23 NOTE — ED Provider Notes (Signed)
  [place] [facility] Provider Note   CSN: [id number] Arrival date & time: 05/23/23  4098     History  Chief Complaint  Patient presents with   Exposure to STD    Erin Savage is a 42 y.o. female.  The history is provided by the patient.  Patient reports she cheated on her partner & would like to be tested for STDs.  She reports she may have a yeast infection as she recently had antibiotics and now has vaginal itching.  No other complaints     Home Medications Prior to Admission medications   Not on File      Allergies    Patient has no known allergies.    Review of Systems   Review of Systems  Physical Exam Updated Vital Signs BP 118/75   Pulse 60   Temp 97.6 F (36.4 C) (Oral)   Resp 18   Ht 1.676 m (5\' 6" )   Wt 83 kg   LMP 05/16/2023 (Exact Date)   SpO2 99%   BMI 29.54 kg/m  Physical Exam CONSTITUTIONAL: Well developed/well nourished, HEAD: Normocephalic/atraumatic NEURO: Pt is awake/alert/appropriate, moves all extremitiesx4.  No facial droop.   EXTREMITIES: full ROM SKIN: warm, color normal PSYCH: no abnormalities of mood noted, alert and oriented to situation  ED Results / Procedures / Treatments   Labs (all labs ordered are listed, but only abnormal results are displayed) Labs Reviewed  URINALYSIS, ROUTINE W REFLEX MICROSCOPIC - Abnormal; Notable for the following components:      Result Value   APPearance CLOUDY (*)    Hgb urine dipstick TRACE (*)    Leukocytes,Ua SMALL (*)    All other components within normal limits  URINALYSIS, MICROSCOPIC (REFLEX) - Abnormal; Notable for the following components:   Bacteria, UA MANY (*)    All other components within normal limits  PREGNANCY, URINE  GC/CHLAMYDIA PROBE AMP ([place]) NOT AT [facility]    EKG None  Radiology No results found.  Procedures Procedures    Medications Ordered in ED Medications  fluconazole (DIFLUCAN) tablet 150 mg (has no  administration in time range)    ED Course/ Medical Decision Making/ A&P                             Medical Decision Making Amount and/or Complexity of Data Reviewed Labs: ordered.  Risk Prescription drug management.   Patient presents because she would like STD testing.  GC/chlamydia testing pending at this time.  Referred to gynecology for further evaluation and would likely benefit from Pap smear        Final Clinical Impression(s) / ED Diagnoses Final diagnoses:  Screen for STD (sexually transmitted disease)    Rx / DC Orders ED Discharge Orders     None         Zadie Rhine, MD 05/23/23 [phone]

## 2023-05-23 NOTE — ED Triage Notes (Signed)
Patient recently treated for BV and thinks she has a yeast infection from the antibiotics.  Patient also requesting STD testing.

## 2023-05-25 LAB — GC/CHLAMYDIA PROBE AMP (~~LOC~~) NOT AT ARMC
Chlamydia: NEGATIVE
Comment: NEGATIVE
Comment: NORMAL
Neisseria Gonorrhea: NEGATIVE

## 2023-08-03 ENCOUNTER — Encounter: Payer: Self-pay | Admitting: Obstetrics and Gynecology

## 2023-09-08 DIAGNOSIS — L700 Acne vulgaris: Secondary | ICD-10-CM | POA: Diagnosis not present

## 2023-09-08 DIAGNOSIS — L905 Scar conditions and fibrosis of skin: Secondary | ICD-10-CM | POA: Diagnosis not present

## 2023-09-08 DIAGNOSIS — L728 Other follicular cysts of the skin and subcutaneous tissue: Secondary | ICD-10-CM | POA: Diagnosis not present

## 2023-09-14 ENCOUNTER — Other Ambulatory Visit (HOSPITAL_COMMUNITY)
Admission: RE | Admit: 2023-09-14 | Discharge: 2023-09-14 | Disposition: A | Discharge status: Home / Self Care | Payer: BC Managed Care – PPO | Source: Ambulatory Visit | Attending: Obstetrics and Gynecology | Admitting: Obstetrics and Gynecology

## 2023-09-14 ENCOUNTER — Encounter: Payer: Self-pay | Admitting: Obstetrics & Gynecology

## 2023-09-14 ENCOUNTER — Ambulatory Visit: Payer: BC Managed Care – PPO | Admitting: Obstetrics & Gynecology

## 2023-09-14 VITALS — BP 117/67 | HR 66 | Ht 65.0 in | Wt 169.0 lb

## 2023-09-14 DIAGNOSIS — Z113 Encounter for screening for infections with a predominantly sexual mode of transmission: Secondary | ICD-10-CM

## 2023-09-14 DIAGNOSIS — Z1231 Encounter for screening mammogram for malignant neoplasm of breast: Secondary | ICD-10-CM

## 2023-09-14 DIAGNOSIS — Z1339 Encounter for screening examination for other mental health and behavioral disorders: Secondary | ICD-10-CM | POA: Diagnosis not present

## 2023-09-14 DIAGNOSIS — Z01419 Encounter for gynecological examination (general) (routine) without abnormal findings: Secondary | ICD-10-CM | POA: Insufficient documentation

## 2023-09-14 DIAGNOSIS — N898 Other specified noninflammatory disorders of vagina: Secondary | ICD-10-CM

## 2023-09-14 DIAGNOSIS — N760 Acute vaginitis: Secondary | ICD-10-CM

## 2023-09-14 DIAGNOSIS — A5901 Trichomonal vulvovaginitis: Secondary | ICD-10-CM

## 2023-09-14 DIAGNOSIS — B9689 Other specified bacterial agents as the cause of diseases classified elsewhere: Secondary | ICD-10-CM

## 2023-09-14 HISTORY — DX: Trichomonal vulvovaginitis: A59.01

## 2023-09-14 NOTE — Progress Notes (Signed)
GYNECOLOGY ANNUAL PREVENTATIVE CARE ENCOUNTER NOTE  History:     Erin Savage is a 42 y.o. [id number] female here for a routine annual gynecologic exam and to establish care.  Current complaints: some irritating vaginal discharge for a few days, wants evaluation.  Also wants standard STI screening.   Denies abnormal vaginal bleeding, discharge, pelvic pain, problems with intercourse or other gynecologic concerns.    Gynecologic History No LMP recorded (lmp unknown). Contraception: abstinence Last Pap: many years ago. Result was normal with negative HPV  Obstetric History OB History  Gravida Para Term Preterm AB Living  2 2 2     2   SAB IAB Ectopic Multiple Live Births          2    # Outcome Date GA Lbr Len/2nd Weight Sex Type Anes PTL Lv  2 Term 2010 [redacted]w[redacted]d   M Vag-Spont EPI N LIV  1 Term 2003 [redacted]w[redacted]d   M Vag-Spont EPI N LIV    History reviewed. No pertinent past medical history.  History reviewed. No pertinent surgical history.  Current Outpatient Medications on File Prior to Visit  Medication Sig Dispense Refill   Clindamycin-Benzoyl Per, Refr, gel Apply topically every morning.     doxycycline (VIBRAMYCIN) 100 MG capsule Take 100 mg by mouth 2 (two) times daily.     No current facility-administered medications on file prior to visit.    No Known Allergies  Social History:  reports that she has quit smoking. Her smoking use included cigarettes. She has a 3 pack-year smoking history. She has never used smokeless tobacco. She reports current alcohol use. She reports that she does not use drugs.  History reviewed. No pertinent family history.  The following portions of the patient's history were reviewed and updated as appropriate: allergies, current medications, past family history, past medical history, past social history, past surgical history and problem list.  Review of Systems Pertinent items noted in HPI and remainder of comprehensive ROS otherwise  negative.  Physical Exam:  BP 117/67   Pulse 66   Ht 5\' 5"  (1.651 m)   Wt 169 lb (76.7 kg)   LMP  (LMP Unknown)   BMI 28.12 kg/m  CONSTITUTIONAL: Well-developed, well-nourished female in no acute distress.  HENT:  Normocephalic, atraumatic, External right and left ear normal.  EYES: Conjunctivae and EOM are normal. Pupils are equal, round, and reactive to light. No scleral icterus.  NECK: Normal range of motion, supple, no masses.  Normal thyroid.  SKIN: Skin is warm and dry. No rash noted. Not diaphoretic. No erythema. No pallor. MUSCULOSKELETAL: Normal range of motion. No tenderness.  No cyanosis, clubbing, or edema. NEUROLOGIC: Alert and oriented to person, place, and time. Normal reflexes, muscle tone coordination.  PSYCHIATRIC: Normal mood and affect. Normal behavior. Normal judgment and thought content. CARDIOVASCULAR: Normal heart rate noted, regular rhythm RESPIRATORY: Clear to auscultation bilaterally. Effort and breath sounds normal, no problems with respiration noted. BREASTS: Symmetric in size. No masses, tenderness, skin changes, nipple drainage, or lymphadenopathy bilaterally. Performed in the presence of a chaperone. ABDOMEN: Soft, no distention noted.  No tenderness, rebound or guarding.  PELVIC: Normal appearing external genitalia and urethral meatus; normal appearing vaginal mucosa and cervix.  Thin white vaginal discharge noted, testing sample.  Pap smear obtained.  Normal uterine size, no other palpable masses, no uterine or adnexal tenderness.  Performed in the presence of a chaperone.   Assessment and Plan:    1. [facility]ag[facility], will follow up results and manage accordingly.  2. Routine screening for STI (sexually transmitted infection) STI screen done, will follow up results and manage accordingly. - Cervicovaginal ancillary only( [place]) - RPR+HBsAg+HCVAb+HIV  3. Breast cancer screening by  mammogram Mammogram scheduled for breast cancer screening. - MM 3D SCREENING MAMMOGRAM BILATERAL BREAST; Future  4. Well woman exam with routine gynecological exam - Cytology - PAP( [place]) Will follow up results of pap smear and manage accordingly. Routine preventative health maintenance measures emphasized. Please refer to After Visit Summary for other counseling recommendations.      Jaynie Collins, MD, FACOG Obstetrician & Gynecologist, [facility] for [employer], [facility]

## 2023-09-15 ENCOUNTER — Encounter: Payer: Self-pay | Admitting: Obstetrics & Gynecology

## 2023-09-15 ENCOUNTER — Telehealth: Payer: Self-pay

## 2023-09-15 LAB — RPR+HBSAG+HCVAB+...
HIV Screen 4th Generation wRfx: NONREACTIVE
Hep C Virus Ab: NONREACTIVE
Hepatitis B Surface Ag: NEGATIVE
RPR Ser Ql: NONREACTIVE

## 2023-09-15 LAB — CERVICOVAGINAL ANCILLARY ONLY
Bacterial Vaginitis (gardnerella): POSITIVE — AB
Candida Glabrata: NEGATIVE
Candida Vaginitis: NEGATIVE
Chlamydia: NEGATIVE
Comment: NEGATIVE
Comment: NEGATIVE
Comment: NEGATIVE
Comment: NEGATIVE
Comment: NEGATIVE
Comment: NORMAL
Neisseria Gonorrhea: NEGATIVE
Trichomonas: POSITIVE — AB

## 2023-09-15 MED ORDER — METRONIDAZOLE 500 MG PO TABS: 500.0000 mg | ORAL_TABLET | Freq: Two times a day (BID) | ORAL | 0 refills | Status: AC

## 2023-09-15 NOTE — Progress Notes (Signed)
Patient has trichomonal vaginitis.  Negative testing for other STIs.  Patient needs to let partner(s) know so the partner(s) can get testing and treatment. Patient and sex partner(s) should abstain from unprotected sexual activity for two weeks after everyone receives appropriate treatment.  Metronidazole was prescribed for patient.  Patient can return in about 4 weeks after treatment for repeat test of cure.  Please call to inform patient of results and recommendations, and advise to pick up prescription and take as directed.  Of note, patient also was noted to have bacterial vaginitis, this will also be treated with same medication.  Please advise patient to practice safe sex at all times.  Jaynie Collins, MD

## 2023-09-15 NOTE — Addendum Note (Signed)
Addended by: Jaynie Collins A on: 09/15/2023 11:56 AM   Modules accepted: Orders

## 2023-09-15 NOTE — Telephone Encounter (Signed)
-----   Message from Jaynie Collins sent at 09/15/2023 11:56 AM EST ----- Patient has trichomonal vaginitis.  Negative testing for other STIs.  Patient needs to let partner(s) know so the partner(s) can get testing and treatment. Patient and sex partner(s) should abstain from unprotected sexual activity for two weeks after everyone receives appropriate treatment.  Metronidazole was prescribed for patient.  Patient can return in about 4 weeks after treatment for repeat test of cure.  Please call to inform patient of results and recommendations, and advise to pick up prescription and take as directed.  Of note, patient also was noted to have bacterial vaginitis, this will also be treated with same medication.  Please advise patient to practice safe sex at all times.  Jaynie Collins, MD

## 2023-09-15 NOTE — Telephone Encounter (Signed)
Patient returning missed call. Verified patient seen and understands lab results. Patient has no questions and TOC scheduled for 4 weeks.

## 2023-09-17 LAB — CYTOLOGY - PAP
Comment: NEGATIVE
Diagnosis: NEGATIVE
High risk HPV: NEGATIVE

## 2023-10-20 ENCOUNTER — Encounter (HOSPITAL_BASED_OUTPATIENT_CLINIC_OR_DEPARTMENT_OTHER): Payer: Self-pay

## 2023-10-20 ENCOUNTER — Ambulatory Visit (HOSPITAL_BASED_OUTPATIENT_CLINIC_OR_DEPARTMENT_OTHER)
Admission: RE | Admit: 2023-10-20 | Discharge: 2023-10-20 | Disposition: A | Discharge status: Home / Self Care | Payer: BC Managed Care – PPO | Source: Ambulatory Visit | Attending: Obstetrics & Gynecology | Admitting: Obstetrics & Gynecology

## 2023-10-20 ENCOUNTER — Other Ambulatory Visit (HOSPITAL_COMMUNITY)
Admission: RE | Admit: 2023-10-20 | Discharge: 2023-10-20 | Disposition: A | Discharge status: Home / Self Care | Payer: BC Managed Care – PPO | Source: Ambulatory Visit

## 2023-10-20 ENCOUNTER — Ambulatory Visit (INDEPENDENT_AMBULATORY_CARE_PROVIDER_SITE_OTHER): Payer: BC Managed Care – PPO

## 2023-10-20 DIAGNOSIS — N898 Other specified noninflammatory disorders of vagina: Secondary | ICD-10-CM

## 2023-10-20 DIAGNOSIS — Z1231 Encounter for screening mammogram for malignant neoplasm of breast: Secondary | ICD-10-CM | POA: Insufficient documentation

## 2023-10-20 DIAGNOSIS — Z113 Encounter for screening for infections with a predominantly sexual mode of transmission: Secondary | ICD-10-CM | POA: Diagnosis not present

## 2023-10-20 NOTE — Progress Notes (Signed)
SUBJECTIVE:  42 y.o. female presents for TOC. Denies abnormal vaginal bleeding or significant pelvic pain or fever. No UTI symptoms. History of known exposure to Trichomonas.  No LMP recorded.  OBJECTIVE:  She appears well, afebrile. Urine dipstick: not done.  ASSESSMENT:  TOC   PLAN:  GC, chlamydia, trichomonas, BVAG, CVAG probe sent to lab. Treatment: To be determined once lab results are received ROV prn if symptoms persist or worsen.

## 2023-10-21 LAB — CERVICOVAGINAL ANCILLARY ONLY
Bacterial Vaginitis (gardnerella): NEGATIVE
Candida Glabrata: NEGATIVE
Candida Vaginitis: POSITIVE — AB
Chlamydia: NEGATIVE
Comment: NEGATIVE
Comment: NEGATIVE
Comment: NEGATIVE
Comment: NEGATIVE
Comment: NEGATIVE
Comment: NORMAL
Neisseria Gonorrhea: NEGATIVE
Trichomonas: POSITIVE — AB

## 2023-10-22 ENCOUNTER — Other Ambulatory Visit: Payer: Self-pay

## 2023-10-22 ENCOUNTER — Telehealth: Payer: Self-pay

## 2023-10-22 DIAGNOSIS — B379 Candidiasis, unspecified: Secondary | ICD-10-CM

## 2023-10-22 DIAGNOSIS — A599 Trichomoniasis, unspecified: Secondary | ICD-10-CM

## 2023-10-22 DIAGNOSIS — A5901 Trichomonal vulvovaginitis: Secondary | ICD-10-CM

## 2023-10-22 MED ORDER — TINIDAZOLE 500 MG PO TABS: ORAL_TABLET | ORAL | 1 refills | Status: DC

## 2023-10-22 MED ORDER — TINIDAZOLE 500 MG PO TABS: ORAL_TABLET | ORAL | 0 refills | Status: DC

## 2023-10-22 MED ORDER — FLUCONAZOLE 150 MG PO TABS: 150.0000 mg | ORAL_TABLET | ORAL | 3 refills | Status: DC

## 2023-10-22 NOTE — Telephone Encounter (Signed)
Patient called requesting results. Patient informed of results. Consulted with Dr. Adrian Blackwater, received verbal order for Tindamax  500mg  PO BID x 5 days.  Rx sent to pharmacy.  TOC scheduled for patient.

## 2023-11-24 ENCOUNTER — Ambulatory Visit (INDEPENDENT_AMBULATORY_CARE_PROVIDER_SITE_OTHER): Payer: BC Managed Care – PPO

## 2023-11-24 ENCOUNTER — Other Ambulatory Visit (HOSPITAL_COMMUNITY)
Admission: RE | Admit: 2023-11-24 | Discharge: 2023-11-24 | Disposition: A | Discharge status: Home / Self Care | Payer: BC Managed Care – PPO | Source: Ambulatory Visit | Attending: Family Medicine | Admitting: Family Medicine

## 2023-11-24 DIAGNOSIS — N898 Other specified noninflammatory disorders of vagina: Secondary | ICD-10-CM

## 2023-11-24 DIAGNOSIS — Z113 Encounter for screening for infections with a predominantly sexual mode of transmission: Secondary | ICD-10-CM | POA: Diagnosis not present

## 2023-11-24 NOTE — Progress Notes (Signed)
 SUBJECTIVE:  43 y.o. female complains of white vaginal discharge since Sunday and presents for a test of cure. Denies abnormal vaginal bleeding or significant pelvic pain or fever. No UTI symptoms. Known history of trichomonas.   No LMP recorded.  OBJECTIVE:  She appears well, afebrile. Urine dipstick: not done.  ASSESSMENT:  Vaginal Discharge    PLAN:  GC, chlamydia, trichomonas, BVAG, CVAG probe sent to lab. Treatment: To be determined once lab results are received ROV prn if symptoms persist or worsen.

## 2023-11-26 ENCOUNTER — Other Ambulatory Visit: Payer: Self-pay

## 2023-11-26 ENCOUNTER — Telehealth: Payer: Self-pay

## 2023-11-26 DIAGNOSIS — A599 Trichomoniasis, unspecified: Secondary | ICD-10-CM

## 2023-11-26 LAB — CERVICOVAGINAL ANCILLARY ONLY
Bacterial Vaginitis (gardnerella): NEGATIVE
Candida Glabrata: NEGATIVE
Candida Vaginitis: NEGATIVE
Chlamydia: NEGATIVE
Comment: NEGATIVE
Comment: NEGATIVE
Comment: NEGATIVE
Comment: NEGATIVE
Comment: NEGATIVE
Comment: NORMAL
Neisseria Gonorrhea: NEGATIVE
Trichomonas: POSITIVE — AB

## 2023-11-26 MED ORDER — TINIDAZOLE 500 MG PO TABS: ORAL_TABLET | ORAL | 0 refills | Status: DC

## 2023-11-26 NOTE — Telephone Encounter (Signed)
Patient called requesting lab results. Patient informed. Patient voices understanding. Patient scheduled for a TOC in 4 weeks. Patient instructed to abstain from sexual intercourse for at least 10-14 days after all parties have received treatment.   Rx sent.

## 2023-11-30 ENCOUNTER — Telehealth: Payer: Self-pay

## 2023-11-30 NOTE — Telephone Encounter (Signed)
-----   Message from Levie Heritage sent at 11/27/2023  8:47 AM EST ----- Patient should be offered serum STI screening.

## 2023-11-30 NOTE — Telephone Encounter (Signed)
Will offer at [facility] appointment. Armandina Stammer, RN

## 2023-12-28 ENCOUNTER — Ambulatory Visit: Payer: BC Managed Care – PPO

## 2024-01-08 ENCOUNTER — Other Ambulatory Visit (HOSPITAL_COMMUNITY)
Admission: RE | Admit: 2024-01-08 | Discharge: 2024-01-08 | Disposition: A | Discharge status: Home / Self Care | Payer: BC Managed Care – PPO | Source: Ambulatory Visit | Attending: Obstetrics and Gynecology | Admitting: Obstetrics and Gynecology

## 2024-01-08 ENCOUNTER — Ambulatory Visit: Payer: BC Managed Care – PPO

## 2024-01-08 VITALS — BP 104/66 | HR 58 | Wt 168.0 lb

## 2024-01-08 DIAGNOSIS — Z8619 Personal history of other infectious and parasitic diseases: Secondary | ICD-10-CM | POA: Insufficient documentation

## 2024-01-08 DIAGNOSIS — Z113 Encounter for screening for infections with a predominantly sexual mode of transmission: Secondary | ICD-10-CM

## 2024-01-08 NOTE — Progress Notes (Signed)
 SUBJECTIVE:  43 y.o. female who desires a STI screen. Denies abnormal vaginal discharge, bleeding or significant pelvic pain. No UTI symptoms. Denies history of known exposure to STD.  This is test of cure.  No LMP recorded.  OBJECTIVE:  She appears well.   ASSESSMENT:  STI Screen   PLAN:  Pt offered STI blood screening-declined GC, chlamydia, and trichomonas probe sent to lab.  Treatment: To be determined once lab results are received.  Pt follow up as needed.   Danna Hefty [employer]

## 2024-01-10 LAB — CERVICOVAGINAL ANCILLARY ONLY
Bacterial Vaginitis (gardnerella): NEGATIVE
Candida Glabrata: NEGATIVE
Candida Vaginitis: NEGATIVE
Chlamydia: NEGATIVE
Comment: NEGATIVE
Comment: NEGATIVE
Comment: NEGATIVE
Comment: NEGATIVE
Comment: NEGATIVE
Comment: NORMAL
Neisseria Gonorrhea: NEGATIVE
Trichomonas: POSITIVE — AB

## 2024-01-11 ENCOUNTER — Telehealth: Payer: Self-pay

## 2024-01-11 ENCOUNTER — Encounter: Payer: Self-pay | Admitting: Obstetrics and Gynecology

## 2024-01-11 ENCOUNTER — Other Ambulatory Visit: Payer: Self-pay | Admitting: Obstetrics and Gynecology

## 2024-01-11 DIAGNOSIS — A5909 Other urogenital trichomoniasis: Secondary | ICD-10-CM

## 2024-01-11 DIAGNOSIS — B379 Candidiasis, unspecified: Secondary | ICD-10-CM

## 2024-01-11 MED ORDER — METRONIDAZOLE 500 MG PO TABS: 500.0000 mg | ORAL_TABLET | Freq: Two times a day (BID) | ORAL | 0 refills | Status: AC

## 2024-01-11 MED ORDER — FLUCONAZOLE 150 MG PO TABS: 150.0000 mg | ORAL_TABLET | ORAL | 0 refills | Status: DC

## 2024-01-11 NOTE — Telephone Encounter (Cosign Needed)
 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  States she has completed treatments in the past and denies being sexually active.  Spoke with Dr. Briscoe Deutscher, patient is to complete treatment for this result.  Will do a TOC to verify it treatment was effective.  Erin Savage [employer]

## 2024-01-11 NOTE — Telephone Encounter (Signed)
-----   Message from [facility] sent at 01/11/2024  7:55 AM EST ----- Notify pt of trichomonas. RX's sent. Advised of need for partner TX and no unprotected sex for 7 days after TX.

## 2024-01-26 DIAGNOSIS — L700 Acne vulgaris: Secondary | ICD-10-CM | POA: Diagnosis not present

## 2024-02-09 ENCOUNTER — Other Ambulatory Visit (HOSPITAL_COMMUNITY)
Admission: RE | Admit: 2024-02-09 | Discharge: 2024-02-09 | Disposition: A | Discharge status: Home / Self Care | Source: Ambulatory Visit | Attending: Advanced Practice Midwife | Admitting: Advanced Practice Midwife

## 2024-02-09 ENCOUNTER — Ambulatory Visit

## 2024-02-09 VITALS — Ht 65.0 in | Wt 163.0 lb

## 2024-02-09 DIAGNOSIS — B3731 Acute candidiasis of vulva and vagina: Secondary | ICD-10-CM | POA: Insufficient documentation

## 2024-02-09 DIAGNOSIS — Z8619 Personal history of other infectious and parasitic diseases: Secondary | ICD-10-CM | POA: Diagnosis not present

## 2024-02-09 DIAGNOSIS — Z113 Encounter for screening for infections with a predominantly sexual mode of transmission: Secondary | ICD-10-CM | POA: Diagnosis not present

## 2024-02-09 NOTE — Progress Notes (Signed)
 SUBJECTIVE:  43 y.o. female who is here for TOC. Denies abnormal vaginal discharge, bleeding or significant pelvic pain. No UTI symptoms. Denies history of known exposure to STD.  Patient's last menstrual period was 01/14/2024 (approximate).  OBJECTIVE:  She appears well.   ASSESSMENT:  STI Screen   PLAN:  Pt offered STI blood screening-declined GC, chlamydia, and trichomonas probe sent to lab.  Treatment: To be determined once lab results are received.  Pt follow up as needed.

## 2024-02-10 ENCOUNTER — Other Ambulatory Visit: Payer: Self-pay

## 2024-02-11 LAB — CERVICOVAGINAL ANCILLARY ONLY
Candida Glabrata: POSITIVE — AB
Candida Vaginitis: POSITIVE — AB
Chlamydia: NEGATIVE
Comment: NEGATIVE
Comment: NEGATIVE
Comment: NEGATIVE
Comment: NEGATIVE
Comment: NEGATIVE
Comment: NORMAL
Comment: NORMAL
Neisseria Gonorrhea: NEGATIVE
Trichomonas: NEGATIVE

## 2024-02-12 ENCOUNTER — Encounter: Payer: Self-pay | Admitting: Family Medicine

## 2024-02-12 ENCOUNTER — Other Ambulatory Visit: Payer: Self-pay | Admitting: Family Medicine

## 2024-02-12 DIAGNOSIS — B379 Candidiasis, unspecified: Secondary | ICD-10-CM

## 2024-02-12 MED ORDER — FLUCONAZOLE 150 MG PO TABS: 150.0000 mg | ORAL_TABLET | ORAL | 0 refills | Status: DC

## 2024-04-26 ENCOUNTER — Other Ambulatory Visit: Payer: Self-pay | Admitting: Obstetrics and Gynecology

## 2024-04-26 DIAGNOSIS — B379 Candidiasis, unspecified: Secondary | ICD-10-CM

## 2024-04-27 MED ORDER — FLUCONAZOLE 150 MG PO TABS: 150.0000 mg | ORAL_TABLET | ORAL | 0 refills | Status: DC

## 2024-04-27 NOTE — Addendum Note (Signed)
 Addended by: Ryli Standlee O on: 04/27/2024 10:09 AM   Modules accepted: Orders

## 2024-05-17 ENCOUNTER — Ambulatory Visit

## 2024-06-13 ENCOUNTER — Ambulatory Visit

## 2024-06-13 ENCOUNTER — Other Ambulatory Visit (HOSPITAL_COMMUNITY)
Admission: RE | Admit: 2024-06-13 | Discharge: 2024-06-13 | Disposition: A | Discharge status: Home / Self Care | Source: Ambulatory Visit | Attending: Medical | Admitting: Medical

## 2024-06-13 VITALS — BP 129/74 | HR 62 | Ht 65.0 in | Wt 155.0 lb

## 2024-06-13 DIAGNOSIS — Z202 Contact with and (suspected) exposure to infections with a predominantly sexual mode of transmission: Secondary | ICD-10-CM | POA: Diagnosis not present

## 2024-06-13 NOTE — Progress Notes (Signed)
 SUBJECTIVE:  43 y.o. female who desires a STI screen. Denies abnormal vaginal discharge, bleeding or significant pelvic pain. No UTI symptoms. Possible exposure to STD.  Patient's last menstrual period was 05/23/2024 (approximate).  OBJECTIVE:  She appears well.   ASSESSMENT:  STI Screen   PLAN:  Pt offered STI blood screening-declined GC, chlamydia, and trichomonas probe sent to lab.  Treatment: To be determined once lab results are received.  Pt follow up as needed.

## 2024-06-15 ENCOUNTER — Ambulatory Visit: Payer: Self-pay | Admitting: Medical

## 2024-06-15 DIAGNOSIS — B379 Candidiasis, unspecified: Secondary | ICD-10-CM

## 2024-06-15 LAB — CERVICOVAGINAL ANCILLARY ONLY
Bacterial Vaginitis (gardnerella): NEGATIVE
Candida Glabrata: POSITIVE — AB
Candida Vaginitis: POSITIVE — AB
Chlamydia: NEGATIVE
Comment: NEGATIVE
Comment: NEGATIVE
Comment: NEGATIVE
Comment: NEGATIVE
Comment: NEGATIVE
Comment: NORMAL
Neisseria Gonorrhea: NEGATIVE
Trichomonas: NEGATIVE

## 2024-06-15 MED ORDER — FLUCONAZOLE 150 MG PO TABS: 150.0000 mg | ORAL_TABLET | ORAL | 0 refills | Status: DC

## 2024-08-05 ENCOUNTER — Other Ambulatory Visit: Payer: Self-pay | Admitting: Obstetrics and Gynecology

## 2024-08-05 DIAGNOSIS — B379 Candidiasis, unspecified: Secondary | ICD-10-CM

## 2024-08-28 ENCOUNTER — Other Ambulatory Visit: Payer: Self-pay | Admitting: Medical

## 2024-08-28 DIAGNOSIS — B379 Candidiasis, unspecified: Secondary | ICD-10-CM

## 2024-08-30 ENCOUNTER — Telehealth: Payer: Self-pay

## 2024-08-30 DIAGNOSIS — B379 Candidiasis, unspecified: Secondary | ICD-10-CM

## 2024-08-30 MED ORDER — FLUCONAZOLE 150 MG PO TABS: ORAL_TABLET | ORAL | 0 refills | Status: AC

## 2024-08-30 NOTE — Telephone Encounter (Signed)
 Patient called the office requesting a refill of Diflucan . Diflucan  150 mg take 1 tablet by mouth. Repeat in 3 days if symptoms persists.  Elease Swarm l Myrtice Lowdermilk, CMA
